# Patient Record
Sex: Female | Born: 1935 | Race: White | Hispanic: No | Smoking: Current every day smoker
Health system: Southern US, Community
[De-identification: ages and names within clinical notes are randomized; demographics above are authoritative.]

## PROBLEM LIST (undated history)

## (undated) DIAGNOSIS — D649 Anemia, unspecified: Secondary | ICD-10-CM

## (undated) DIAGNOSIS — J439 Emphysema, unspecified: Secondary | ICD-10-CM

## (undated) DIAGNOSIS — I10 Essential (primary) hypertension: Secondary | ICD-10-CM

## (undated) DIAGNOSIS — J449 Chronic obstructive pulmonary disease, unspecified: Secondary | ICD-10-CM

## (undated) DIAGNOSIS — E785 Hyperlipidemia, unspecified: Secondary | ICD-10-CM

## (undated) DIAGNOSIS — M1990 Unspecified osteoarthritis, unspecified site: Secondary | ICD-10-CM

## (undated) HISTORY — DX: Chronic obstructive pulmonary disease, unspecified: J44.9

## (undated) HISTORY — DX: Anemia, unspecified: D64.9

## (undated) HISTORY — DX: Emphysema, unspecified: J43.9

## (undated) HISTORY — DX: Essential (primary) hypertension: I10

## (undated) HISTORY — DX: Unspecified osteoarthritis, unspecified site: M19.90

## (undated) HISTORY — PX: ABDOMINAL HYSTERECTOMY: SHX81

## (undated) HISTORY — DX: Hyperlipidemia, unspecified: E78.5

---

## 1999-10-20 ENCOUNTER — Encounter: Payer: Self-pay | Admitting: Emergency Medicine

## 1999-10-20 ENCOUNTER — Emergency Department (HOSPITAL_COMMUNITY): Admission: EM | Admit: 1999-10-20 | Discharge: 1999-10-21 | Payer: Self-pay | Admitting: Emergency Medicine

## 2005-08-11 ENCOUNTER — Ambulatory Visit: Payer: Self-pay | Admitting: Family Medicine

## 2005-08-18 ENCOUNTER — Ambulatory Visit: Payer: Self-pay | Admitting: Family Medicine

## 2006-04-25 ENCOUNTER — Ambulatory Visit: Payer: Self-pay | Admitting: Family Medicine

## 2007-08-30 ENCOUNTER — Encounter: Payer: Self-pay | Admitting: Family Medicine

## 2007-09-26 ENCOUNTER — Telehealth: Payer: Self-pay | Admitting: Family Medicine

## 2007-10-31 ENCOUNTER — Other Ambulatory Visit: Admission: RE | Admit: 2007-10-31 | Discharge: 2007-10-31 | Payer: Self-pay | Admitting: Family Medicine

## 2007-10-31 ENCOUNTER — Encounter: Payer: Self-pay | Admitting: Family Medicine

## 2007-10-31 ENCOUNTER — Ambulatory Visit: Payer: Self-pay | Admitting: Family Medicine

## 2007-10-31 DIAGNOSIS — M810 Age-related osteoporosis without current pathological fracture: Secondary | ICD-10-CM | POA: Insufficient documentation

## 2007-10-31 DIAGNOSIS — E039 Hypothyroidism, unspecified: Secondary | ICD-10-CM | POA: Insufficient documentation

## 2007-10-31 DIAGNOSIS — E785 Hyperlipidemia, unspecified: Secondary | ICD-10-CM | POA: Insufficient documentation

## 2007-10-31 DIAGNOSIS — I10 Essential (primary) hypertension: Secondary | ICD-10-CM | POA: Insufficient documentation

## 2007-10-31 DIAGNOSIS — T50995A Adverse effect of other drugs, medicaments and biological substances, initial encounter: Secondary | ICD-10-CM | POA: Insufficient documentation

## 2007-10-31 DIAGNOSIS — M159 Polyosteoarthritis, unspecified: Secondary | ICD-10-CM | POA: Insufficient documentation

## 2007-10-31 DIAGNOSIS — D649 Anemia, unspecified: Secondary | ICD-10-CM | POA: Insufficient documentation

## 2007-11-14 ENCOUNTER — Encounter: Payer: Self-pay | Admitting: Family Medicine

## 2008-10-29 ENCOUNTER — Ambulatory Visit: Payer: Self-pay | Admitting: Family Medicine

## 2008-10-29 DIAGNOSIS — J449 Chronic obstructive pulmonary disease, unspecified: Secondary | ICD-10-CM | POA: Insufficient documentation

## 2008-10-29 DIAGNOSIS — J4489 Other specified chronic obstructive pulmonary disease: Secondary | ICD-10-CM | POA: Insufficient documentation

## 2008-12-13 ENCOUNTER — Emergency Department (HOSPITAL_COMMUNITY): Admission: EM | Admit: 2008-12-13 | Discharge: 2008-12-13 | Payer: Self-pay | Admitting: Emergency Medicine

## 2009-04-21 ENCOUNTER — Ambulatory Visit: Payer: Self-pay | Admitting: Family Medicine

## 2010-03-11 ENCOUNTER — Ambulatory Visit: Payer: Self-pay | Admitting: Family Medicine

## 2010-03-18 LAB — CONVERTED CEMR LAB
ALT: 15 units/L (ref 0–35)
AST: 18 units/L (ref 0–37)
Albumin: 4.2 g/dL (ref 3.5–5.2)
Alkaline Phosphatase: 59 units/L (ref 39–117)
BUN: 19 mg/dL (ref 6–23)
Basophils Absolute: 0 10*3/uL (ref 0.0–0.1)
Basophils Relative: 0.2 % (ref 0.0–3.0)
Bilirubin, Direct: 0.1 mg/dL (ref 0.0–0.3)
CO2: 30 meq/L (ref 19–32)
Calcium: 9.6 mg/dL (ref 8.4–10.5)
Chloride: 102 meq/L (ref 96–112)
Cholesterol: 283 mg/dL — ABNORMAL HIGH (ref 0–200)
Creatinine, Ser: 0.9 mg/dL (ref 0.4–1.2)
Direct LDL: 125.8 mg/dL
Eosinophils Absolute: 0 10*3/uL (ref 0.0–0.7)
Eosinophils Relative: 0.5 % (ref 0.0–5.0)
GFR calc non Af Amer: 64.37 mL/min (ref >60)
Glucose, Bld: 87 mg/dL (ref 70–99)
HCT: 42.2 % (ref 36.0–46.0)
HDL: 141 mg/dL (ref >39.00)
Hemoglobin: 13.8 g/dL (ref 12.0–15.0)
Lymphocytes Relative: 17.8 % (ref 12.0–46.0)
Lymphs Abs: 1.4 10*3/uL (ref 0.7–4.0)
MCHC: 32.8 g/dL (ref 30.0–36.0)
MCV: 96.9 fL (ref 78.0–100.0)
Monocytes Absolute: 0.8 10*3/uL (ref 0.1–1.0)
Monocytes Relative: 9.8 % (ref 3.0–12.0)
Neutro Abs: 5.7 10*3/uL (ref 1.4–7.7)
Neutrophils Relative %: 71.7 % (ref 43.0–77.0)
Platelets: 272 10*3/uL (ref 150.0–400.0)
Potassium: 3.9 meq/L (ref 3.5–5.1)
RBC: 4.35 M/uL (ref 3.87–5.11)
RDW: 12.4 % (ref 11.5–14.6)
Sodium: 142 meq/L (ref 135–145)
TSH: 1.42 microintl units/mL (ref 0.35–5.50)
Total Bilirubin: 0.8 mg/dL (ref 0.3–1.2)
Total CHOL/HDL Ratio: 2
Total Protein: 7.3 g/dL (ref 6.0–8.3)
Triglycerides: 70 mg/dL (ref 0.0–149.0)
VLDL: 14 mg/dL (ref 0.0–40.0)
Vit D, 25-Hydroxy: 12 ng/mL — ABNORMAL LOW (ref 30–89)
WBC: 7.9 10*3/uL (ref 4.5–10.5)

## 2010-03-25 ENCOUNTER — Encounter: Payer: Self-pay | Admitting: Family Medicine

## 2010-05-05 ENCOUNTER — Ambulatory Visit: Payer: Self-pay | Admitting: Family Medicine

## 2011-01-23 LAB — CONVERTED CEMR LAB
ALT: 17 units/L (ref 0–35)
AST: 21 units/L (ref 0–37)
Albumin: 4.1 g/dL (ref 3.5–5.2)
Alkaline Phosphatase: 67 units/L (ref 39–117)
BUN: 13 mg/dL (ref 6–23)
Basophils Absolute: 0 10*3/uL (ref 0.0–0.1)
Basophils Relative: 0.2 % (ref 0.0–1.0)
Bilirubin, Direct: 0.1 mg/dL (ref 0.0–0.3)
CO2: 30 meq/L (ref 19–32)
Calcium: 9.8 mg/dL (ref 8.4–10.5)
Chloride: 105 meq/L (ref 96–112)
Cholesterol: 284 mg/dL (ref 0–200)
Creatinine, Ser: 0.7 mg/dL (ref 0.4–1.2)
Direct LDL: 133.3 mg/dL
Eosinophils Absolute: 0 10*3/uL (ref 0.0–0.6)
Eosinophils Relative: 0.4 % (ref 0.0–5.0)
GFR calc Af Amer: 105 mL/min
GFR calc non Af Amer: 87 mL/min
Glucose, Bld: 82 mg/dL (ref 70–99)
HCT: 42.7 % (ref 36.0–46.0)
HDL: 127.6 mg/dL (ref >39.0)
Hemoglobin: 15.1 g/dL — ABNORMAL HIGH (ref 12.0–15.0)
Lymphocytes Relative: 22.2 % (ref 12.0–46.0)
MCHC: 35.4 g/dL (ref 30.0–36.0)
MCV: 96.3 fL (ref 78.0–100.0)
Monocytes Absolute: 0.7 10*3/uL (ref 0.2–0.7)
Monocytes Relative: 10.3 % (ref 3.0–11.0)
Neutro Abs: 4.8 10*3/uL (ref 1.4–7.7)
Neutrophils Relative %: 66.9 % (ref 43.0–77.0)
Platelets: 255 10*3/uL (ref 150–400)
Potassium: 4.7 meq/L (ref 3.5–5.1)
RBC: 4.43 M/uL (ref 3.87–5.11)
RDW: 12.5 % (ref 11.5–14.6)
Sodium: 143 meq/L (ref 135–145)
TSH: 1.33 microintl units/mL (ref 0.35–5.50)
Total Bilirubin: 0.8 mg/dL (ref 0.3–1.2)
Total CHOL/HDL Ratio: 2.2
Total Protein: 6.8 g/dL (ref 6.0–8.3)
Triglycerides: 81 mg/dL (ref 0–149)
VLDL: 16 mg/dL (ref 0–40)
WBC: 7.1 10*3/uL (ref 4.5–10.5)

## 2011-01-25 NOTE — Letter (Signed)
Summary: Generic Letter  [place] [facility]  [address] [place], [place] [address]   Phone: [phone]  Fax: [phone]    03/25/2010  AUNA MIKKELSEN [address] [place], [place]  [address]  Dear Ms. Yetta Barre,  We have tried numerous times to reach you via phone but unsuccessful. Your lab report was good except your Vitamin D was low in which I have sent in a prescription in to your local drug store.          Sincerely,   Dr. Gwenyth Bender Stafford,MD

## 2011-01-25 NOTE — Assessment & Plan Note (Signed)
Summary: medical clearance for playin tennis/cjr   Vital Signs:  Patient profile:   77 year old female Weight:      103 pounds BMI:     19.53 O2 Sat:      93 % Temp:     98.3 degrees F Pulse rate:   102 / minute BP sitting:   160 / 118  (left arm)  Vitals Entered By: Pura Spice, RN (March 11, 2010 3:12 PM) CC: wants clearance to play tennis Been off BP med for 1 yr.  smoking 1 ppd    History of Present Illness: this 77 year old white female is in for followup evaluation of her blood pressure and physical and evaluation regarding returning to playing tennis. Her blood pressure was found to be elevated on arrival is 160/118 and repeated and was 160/110. She relates she has not taken her blood pressure medicine in the past year. Relates that her arthritis is improved including her right shoulder which had limited movement proximally her go but is now much better. She has stopped Zestoretic as well as Fosamax She desires to discuss a non-medicine approach to her cessation of smoking She has not had a mammogram or bone density in 2 years but recommended at this time She has no major complaints except that she does have some stiffness after sitting  Preventive Screening-Counseling & Management  Alcohol-Tobacco     Smoking Status: current     Packs/Day: 1.0     Year Started: 1971  Allergies (verified): No Known Drug Allergies  Past History:  Past Medical History: Last updated: 10/31/2007 Hypertension  Past Surgical History: Last updated: 10/31/2007 Hysterectomy  Risk Factors: Smoking Status: current (03/11/2010) Packs/Day: 1.0 (03/11/2010)  Social History: Smoking Status:  current Packs/Day:  1.0  Review of Systems  The patient denies anorexia, fever, weight loss, weight gain, vision loss, decreased hearing, hoarseness, chest pain, syncope, dyspnea on exertion, peripheral edema, prolonged cough, headaches, hemoptysis, abdominal pain, melena, hematochezia, severe  indigestion/heartburn, hematuria, incontinence, genital sores, muscle weakness, suspicious skin lesions, transient blindness, difficulty walking, depression, unusual weight change, abnormal bleeding, enlarged lymph nodes, angioedema, breast masses, and testicular masses.    Physical Exam  General:  Well-developed,well-nourished,in no acute distress; alert,appropriate and cooperative throughout examinationunderweight appearing.   Head:  Normocephalic and atraumatic without obvious abnormalities. No apparent alopecia or balding. Eyes:  No corneal or conjunctival inflammation noted. EOMI. Perrla. Funduscopic exam benign, without hemorrhages, exudates or papilledema. Vision grossly normal. Ears:  External ear exam shows no significant lesions or deformities.  Otoscopic examination reveals clear canals, tympanic membranes are intact bilaterally without bulging, retraction, inflammation or discharge. Hearing is grossly normal bilaterally. Nose:  External nasal examination shows no deformity or inflammation. Nasal mucosa are pink and moist without lesions or exudates. Mouth:  Oral mucosa and oropharynx without lesions or exudates.  Teeth in good repair. Neck:  tenderness right lateral cervical spine Chest Wall:  No deformities, masses, or tenderness noted. Breasts:  No mass, nodules, thickening, tenderness, bulging, retraction, inflamation, nipple discharge or skin changes noted.   Lungs:  Normal respiratory effort, chest expands symmetrically. Lungs are clear to auscultation, no crackles or wheezes. Heart:  Normal rate and regular rhythm. S1 and S2 normal without gallop, murmur, click, rub or other extra sounds. Abdomen:  Bowel sounds positive,abdomen soft and non-tender without masses, organomegaly or hernias noted. Rectal:  not examined Genitalia:  not examined Msk:  examination of the right shoulder is essentially normal this visit compared to last year  when she had limited movement and pain on  movement Pulses:  R and L carotid,radial,femoral,dorsalis pedis and posterior tibial pulses are full and equal bilaterally Extremities:  No clubbing, cyanosis, edema, or deformity noted with normal full range of motion of all joints.   Neurologic:  No cranial nerve deficits noted. Station and gait are normal. Plantar reflexes are down-going bilaterally. DTRs are symmetrical throughout. Sensory, motor and coordinative functions appear intact.   Impression & Recommendations:  Problem # 1:  HYPERTENSION (ICD-401.9) Assessment Deteriorated  The following medications were removed from the medication list:    Zestoretic 20-12.5 Mg Tabs (Lisinopril-hydrochlorothiazide) .Marland Kitchen... 1 once daily for blood pressure Her updated medication list for this problem includes:    Lisinopril 20 Mg Tabs (Lisinopril) .Marland Kitchen... 1 each day for blood pressure  Orders: TLB-BMP (Basic Metabolic Panel-BMET) (80048-METABOL)  Problem # 2:  COPD (ICD-496) Assessment: Unchanged  Problem # 3:  ARTHRITIS (ICD-716.90) Assessment: Improved Nabumetone 750 mg bid  Problem # 4:  UNSPECIFIED OSTEOPOROSIS (ICD-733.00) Assessment: Unchanged  Her updated medication list for this problem includes:    Fosamax 70 Mg Tabs (Alendronate sodium) .Marland Kitchen... 1 tab weekly bone density study to be done in near future Orders: T-Vitamin D (25-Hydroxy) (82956-21308)  Complete Medication List: 1)  Nabumetone 750 Mg Tabs (Nabumetone) .Marland Kitchen.. 1 two times a day after meals for arthritis 2)  Fosamax 70 Mg Tabs (Alendronate sodium) .Marland Kitchen.. 1 tab weekly 3)  Aspirin Adult Low Strength 81 Mg Tbec (Aspirin) .Marland Kitchen.. 1 qd 4)  Lisinopril 20 Mg Tabs (Lisinopril) .Marland Kitchen.. 1 each day for blood pressure  Other Orders: Venipuncture (65784) TLB-Lipid Panel (80061-LIPID) TLB-CBC Platelet - w/Differential (85025-CBCD) TLB-Hepatic/Liver Function Pnl (80076-HEPATIC) TLB-TSH (Thyroid Stimulating Hormone) (84443-TSH) UA Dipstick w/o Micro (automated)  (81003) Prescription  Created Electronically [id number])  Patient Instructions: 1)  Feelm good about stopping smoking 2)  Return in 6 weeks for blood pressure 3)  start calcium600 with Vit D 400 twice daily to prevent osteoporosis 4)  schedule mammogram and bone density 5)  continue Fosamax and Nabumetone and aspirin 81 mg Prescriptions: NABUMETONE 750 MG TABS (NABUMETONE) 1 two times a day after meals for arthritis  #60 x 11   Entered and Authorized by:   Judithann Sheen MD   Signed by:   Judithann Sheen MD on 03/11/2010   Method used:   Electronically to        [facility]  [phone]* (retail)       [address]       [place], [place]  [address]       Ph: [phone] or [phone]       Fax: [phone]   RxID:   [id number] LISINOPRIL 20 MG TABS (LISINOPRIL) 1 each day for blood pressure  #30 x 11   Entered and Authorized by:   Judithann Sheen MD   Signed by:   Judithann Sheen MD on 03/11/2010   Method used:   Electronically to        [facility]  [phone]* (retail)       [address]       [place], [place]  [address]       Ph: [phone] or [phone]       Fax: [phone]   RxID:   [id number] FOSAMAX 70 MG TABS (ALENDRONATE SODIUM) 1 tab weekly  #4 x 11  Entered and Authorized by:   Judithann Sheen MD   Signed by:   Judithann Sheen MD on 03/11/2010   Method used:   Electronically to        [facility]  [phone]* (retail)       [address]       [place], [place]  [address]       Ph: [phone] or [phone]       Fax: [phone]   RxID:   [id number]   Appended Document: Orders Update     Clinical Lists Changes  Observations: Added new observation of COMMENTS: Wynona Canes, CMA  March 12, 2010 8:27 AM  (03/11/2010 8:26) Added new observation of PH URINE: 5.5  (03/11/2010 8:26) Added new observation of SPEC GR URIN: 1.025   (03/11/2010 8:26) Added new observation of APPEARANCE U: Clear  (03/11/2010 8:26) Added new observation of UA COLOR: yellow  (03/11/2010 8:26) Added new observation of WBC DIPSTK U: negative  (03/11/2010 8:26) Added new observation of NITRITE URN: negative  (03/11/2010 8:26) Added new observation of UROBILINOGEN: 0.2  (03/11/2010 8:26) Added new observation of PROTEIN, URN: trace  (03/11/2010 8:26) Added new observation of BLOOD UR DIP: negative  (03/11/2010 8:26) Added new observation of KETONES URN: negative  (03/11/2010 8:26) Added new observation of BILIRUBIN UR: negative  (03/11/2010 8:26) Added new observation of GLUCOSE, URN: negative  (03/11/2010 8:26)      Laboratory Results   Urine Tests  Date/Time Recieved: March 12, 2010 8:27 AM  Date/Time Reported: March 12, 2010 8:27 AM   Routine Urinalysis   Color: yellow Appearance: Clear Glucose: negative   (Normal Range: Negative) Bilirubin: negative   (Normal Range: Negative) Ketone: negative   (Normal Range: Negative) Spec. Gravity: 1.025   (Normal Range: 1.003-1.035) Blood: negative   (Normal Range: Negative) pH: 5.5   (Normal Range: 5.0-8.0) Protein: trace   (Normal Range: Negative) Urobilinogen: 0.2   (Normal Range: 0-1) Nitrite: negative   (Normal Range: Negative) Leukocyte Esterace: negative   (Normal Range: Negative)    Comments: Wynona Canes, CMA  March 12, 2010 8:27 AM     Appended Document: medical clearance for playin tennis/cjr ng urine

## 2011-01-25 NOTE — Assessment & Plan Note (Signed)
Summary: fu per pt/njr   Vital Signs:  Patient profile:   77 year old female Weight:      103 pounds O2 Sat:      96 % Temp:     98.2 degrees F Pulse rate:   110 / minute BP sitting:   180 / 100  (left arm)  Vitals Entered By: Pura Spice, RN (May 05, 2010 11:06 AM) CC: reck bp    History of Present Illness: This 77 year old white single female who smokes one half pack of cigarettes per day is in for followup examination of her hypertension and her arthritis also did discuss osteopenia Her blood pressure was rechecked after her arrival and was 152/90 She thinks to be good tone has been doing fine regarding her arthritis  and would like a refill No new complaints  Preventive Screening-Counseling & Management  Alcohol-Tobacco     Smoking Status: current     Packs/Day: 0.5     Year Started: 1951  Allergies (verified): No Known Drug Allergies  Past History:  Past Medical History: Last updated: 10/31/2007 Hypertension  Past Surgical History: Last updated: 10/31/2007 Hysterectomy  Social History: Last updated: 05/05/2010 Current Smoker  Risk Factors: Smoking Status: current (05/05/2010) Packs/Day: 0.5 (05/05/2010)  Social History: Current Smoker Packs/Day:  0.5  Review of Systems      See HPI  The patient denies anorexia, fever, weight loss, weight gain, vision loss, decreased hearing, hoarseness, chest pain, syncope, dyspnea on exertion, peripheral edema, prolonged cough, headaches, hemoptysis, abdominal pain, melena, hematochezia, severe indigestion/heartburn, hematuria, incontinence, genital sores, muscle weakness, suspicious skin lesions, transient blindness, difficulty walking, depression, unusual weight change, abnormal bleeding, enlarged lymph nodes, angioedema, breast masses, and testicular masses.    Physical Exam  General:  Well-developed,well-nourished,in no acute distress; alert,appropriate and cooperative throughout examinationunderweight  appearing.   Neck:  No deformities, masses, or tenderness noted.carotid normal Lungs:  Normal respiratory effort, chest expands symmetrically. Lungs are clear to auscultation, no crackles or wheezes. Heart:  Normal rate and regular rhythm. S1 and S2 normal without gallop, murmur, click, rub or other extra sounds. Abdomen:  Bowel sounds positive,abdomen soft and non-tender without masses, organomegaly or hernias noted. Msk:  No deformity or scoliosis noted of thoracic or lumbar spine.   Extremities:  No clubbing, cyanosis, edema, or deformity noted with normal full range of motion of all joints.     Impression & Recommendations:  Problem # 1:  COPD (ICD-496) Assessment Unchanged not severe requiring medication  Problem # 2:  ARTHRITIS (ICD-716.90) Assessment: Improved  Orders: Prescription Created Electronically [id number])  Problem # 3:  HYPERTENSION (ICD-401.9) Assessment: Improved  The following medications were removed from the medication list:    Lisinopril 20 Mg Tabs (Lisinopril) .Marland Kitchen... 1 each day for blood pressure Her updated medication list for this problem includes:    Lisinopril-hydrochlorothiazide 20-12.5 Mg Tabs (Lisinopril-hydrochlorothiazide) .Marland Kitchen... 1 once daily for blood pressure  Complete Medication List: 1)  Nabumetone 750 Mg Tabs (Nabumetone) .Marland Kitchen.. 1 two times a day after meals for arthritis 2)  Fosamax 70 Mg Tabs (Alendronate sodium) .Marland Kitchen.. 1 tab weekly 3)  Aspirin Adult Low Strength 81 Mg Tbec (Aspirin) .Marland Kitchen.. 1 qd 4)  Vitamin D (ergocalciferol) 50000 Unit Caps (Ergocalciferol) .Marland KitchenMarland KitchenMarland Kitchen 1 weekly for 12 weeks 5)  Lisinopril-hydrochlorothiazide 20-12.5 Mg Tabs (Lisinopril-hydrochlorothiazide) .Marland Kitchen.. 1 once daily for blood pressure  Patient Instructions: 1)  bLOOD PRESSURE IS BETTER THAN BEFORE BUT NEEDS TI INCRASE LISINOPRIL-hct 1 per day 2)  continue nabumetone twice dailky  for arthritis 3)  Take Aspirin 81 mg eaach day 4)  continue Fosama weekly Prescriptions: FOSAMAX 70  MG TABS (ALENDRONATE SODIUM) 1 tab weekly  #4 x 11   Entered and Authorized by:   Judithann Sheen MD   Signed by:   Judithann Sheen MD on 05/05/2010   Method used:   Electronically to        [facility]  [phone]* (retail)       [address]       [place], [place]  [address]       Ph: [phone] or [phone]       Fax: [phone]   RxID:   [id number] LISINOPRIL-HYDROCHLOROTHIAZIDE 20-12.5 MG TABS (LISINOPRIL-HYDROCHLOROTHIAZIDE) 1 once daily FOR BLOOD PRESSURE  #30 x 11   Entered and Authorized by:   Judithann Sheen MD   Signed by:   Judithann Sheen MD on 05/05/2010   Method used:   Electronically to        [facility]  [phone]* (retail)       [address]       [place], [place]  [address]       Ph: [phone] or [phone]       Fax: [phone]   RxID:   [id number]

## 2011-04-12 ENCOUNTER — Other Ambulatory Visit: Payer: Self-pay

## 2011-04-12 MED ORDER — NABUMETONE 750 MG PO TABS: 750.0000 mg | ORAL_TABLET | Freq: Two times a day (BID) | ORAL | Status: DC

## 2011-04-12 NOTE — Telephone Encounter (Signed)
rx faxed to [facility]

## 2011-05-12 ENCOUNTER — Telehealth: Payer: Self-pay | Admitting: Family Medicine

## 2011-05-12 NOTE — Telephone Encounter (Signed)
Pt has changed to [facility] mail order and wanted to make Dr Scotty Court aware that [facility] will be sending refill req for pts meds, Lisinopril/HCTZ 20-12.5 mg (Relafen) 750 mg

## 2011-05-13 MED ORDER — NABUMETONE 750 MG PO TABS: 750.0000 mg | ORAL_TABLET | Freq: Two times a day (BID) | ORAL | Status: DC

## 2011-05-13 MED ORDER — LISINOPRIL-HYDROCHLOROTHIAZIDE 20-12.5 MG PO TABS: 1.0000 | ORAL_TABLET | Freq: Every day | ORAL | Status: DC

## 2011-05-13 NOTE — Telephone Encounter (Signed)
Ok per Dr. Scotty Court to sent rx to [facility] for lisinopril and nabumetone

## 2011-05-18 ENCOUNTER — Ambulatory Visit: Payer: Self-pay | Admitting: Family Medicine

## 2011-05-19 ENCOUNTER — Other Ambulatory Visit: Payer: Self-pay

## 2011-05-19 NOTE — Telephone Encounter (Signed)
Error

## 2011-06-01 ENCOUNTER — Ambulatory Visit (INDEPENDENT_AMBULATORY_CARE_PROVIDER_SITE_OTHER): Payer: Medicare Other | Admitting: Family Medicine

## 2011-06-01 ENCOUNTER — Encounter: Payer: Self-pay | Admitting: Family Medicine

## 2011-06-01 VITALS — BP 110/88 | HR 97 | Temp 98.8°F | Wt 102.0 lb

## 2011-06-01 DIAGNOSIS — R350 Frequency of micturition: Secondary | ICD-10-CM

## 2011-06-01 DIAGNOSIS — E785 Hyperlipidemia, unspecified: Secondary | ICD-10-CM

## 2011-06-01 DIAGNOSIS — E039 Hypothyroidism, unspecified: Secondary | ICD-10-CM

## 2011-06-01 DIAGNOSIS — J449 Chronic obstructive pulmonary disease, unspecified: Secondary | ICD-10-CM

## 2011-06-01 DIAGNOSIS — D649 Anemia, unspecified: Secondary | ICD-10-CM

## 2011-06-01 DIAGNOSIS — M129 Arthropathy, unspecified: Secondary | ICD-10-CM

## 2011-06-01 DIAGNOSIS — M1990 Unspecified osteoarthritis, unspecified site: Secondary | ICD-10-CM

## 2011-06-01 DIAGNOSIS — I10 Essential (primary) hypertension: Secondary | ICD-10-CM

## 2011-06-01 LAB — LIPID PANEL
Cholesterol: 292 mg/dL — ABNORMAL HIGH (ref 0–200)
HDL: 127 mg/dL (ref >39.00)
Total CHOL/HDL Ratio: 2
Triglycerides: 91 mg/dL (ref 0.0–149.0)
VLDL: 18.2 mg/dL (ref 0.0–40.0)

## 2011-06-01 LAB — CBC WITH DIFFERENTIAL/PLATELET
Basophils Relative: 0.9 % (ref 0.0–3.0)
Eosinophils Relative: 1 % (ref 0.0–5.0)
Lymphocytes Relative: 21.5 % (ref 12.0–46.0)
Monocytes Relative: 10.8 % (ref 3.0–12.0)
Neutrophils Relative %: 65.8 % (ref 43.0–77.0)
Platelets: 310 10*3/uL (ref 150.0–400.0)
RBC: 3.89 Mil/uL (ref 3.87–5.11)
WBC: 7.9 10*3/uL (ref 4.5–10.5)

## 2011-06-01 LAB — POCT URINALYSIS DIPSTICK
Glucose, UA: NEGATIVE
Ketones, UA: NEGATIVE
Leukocytes, UA: NEGATIVE
Protein, UA: NEGATIVE
Spec Grav, UA: 1.02
Urobilinogen, UA: 0.2

## 2011-06-01 LAB — BASIC METABOLIC PANEL
CO2: 30 mEq/L (ref 19–32)
Calcium: 9.9 mg/dL (ref 8.4–10.5)
Chloride: 106 mEq/L (ref 96–112)
Creatinine, Ser: 1.3 mg/dL — ABNORMAL HIGH (ref 0.4–1.2)
Sodium: 143 mEq/L (ref 135–145)

## 2011-06-01 LAB — TSH: TSH: 1.39 u[IU]/mL (ref 0.35–5.50)

## 2011-06-01 MED ORDER — SULINDAC 200 MG PO TABS: 200.0000 mg | ORAL_TABLET | Freq: Two times a day (BID) | ORAL | Status: DC

## 2011-06-01 NOTE — Patient Instructions (Addendum)
For arthritis take new tab sulindac 200mg  twice daily after meals or if not improved to take Aleve 2 tabs q. day Take ASA 81 mg daily to help prevent heart attack A s usuaL i HAVE TO RECOMMEND SMOKING CESSATION Will call lab results Please get mammogram and bone density in  Near future Try to increase walking or activity

## 2011-06-10 ENCOUNTER — Telehealth: Payer: Self-pay

## 2011-06-10 NOTE — Telephone Encounter (Signed)
Called to give lab results; left a message for pt to return call; Pt needs to repeat test in 6 weeks.

## 2011-06-14 MED ORDER — NAPROXEN SODIUM 220 MG PO TABS: 220.0000 mg | ORAL_TABLET | Freq: Two times a day (BID) | ORAL | Status: DC

## 2011-06-14 NOTE — Progress Notes (Signed)
  Subjective:    Patient ID: Kaylee Li, female    DOB: 03-14-1930, 77 y.o.   MRN: [id number] HPI This 77 year old white didn't have discussed the fact that she continues to have arthritic pain but has bee better since she started Aleve 2 tabs each day. She had been on nabumetone 750 b.i.d. But was not helpful she has been taking the Aleve for some time and it is beneficial Discussed smoking cessation which patient has diminished from 2 packs one pack per day Continues to have shortness of breath on exertion and pO2 94 Blood pressure had been well-controlled 110/88 and haven't continue lisinopril hydrochlorothiazide 20-12.5 mg each day Patient continues to have some neck pain as well as leg pain   Review of Systemssee history of present illness     Objective:   Physical Exam the patient is a thin white female who is in no distress is alert and cooperative HEENT negative carotid pulses good thyroid non palpable Lungs faint breath sounds minimal rhonchi with slight wheeze on deep expiration Heart no evidence of cardiomegaly heart sounds regular without murmurs rhythm regular peripheral pulses good Breasts atrophic negative no masses felt no tenderness axilla clear nipples normal Abdomen liver spleen kidneys nonpalpable no masses no tenderness normal bowel sounds Pelvic and rectal not done Extremities has tenderness over knee joints bilaterally and both leg Neurological negative exam Skin negative        Assessment & Plan:  Hypertension well controlled continue lisinopril hydrochlorothiazide 20 metacarpal 5 mg each day Osteoarthritis to treat with Aleve 2 tabs each day and to increase to 3 if necessary COPD no treatment at this time however recommend smoking cessation and discussed Chantix Osteopenia she discontinued Fosamax daily calcium 600 with vitamin D 400 b.i.d.

## 2011-07-11 ENCOUNTER — Telehealth: Payer: Self-pay | Admitting: Family Medicine

## 2011-07-11 NOTE — Telephone Encounter (Signed)
Pt is still complaining of her arthritis and would like a referral to [facility]. Fax to --[phone].

## 2011-07-18 ENCOUNTER — Other Ambulatory Visit: Payer: Self-pay | Admitting: Family Medicine

## 2011-07-18 DIAGNOSIS — M129 Arthropathy, unspecified: Secondary | ICD-10-CM

## 2011-07-18 DIAGNOSIS — M810 Age-related osteoporosis without current pathological fracture: Secondary | ICD-10-CM

## 2011-07-21 NOTE — Telephone Encounter (Signed)
Done

## 2011-08-08 ENCOUNTER — Telehealth: Payer: Self-pay

## 2011-08-08 NOTE — Telephone Encounter (Signed)
Per pt's son was calling to talk to Dr. Scotty Court about his mother's prescription.  Pt's son request a call from Dr. Scotty Court.

## 2011-08-09 NOTE — Telephone Encounter (Signed)
Pts son called and said that pt was put on new scriptsulindac (CLINORIL) 200 MG tablet  , by Dr Scotty Court and it is not helping pt. Pt has appt to see Rheumatologist tomorrow, but pts son still would like to speak to Dr Scotty Court.

## 2011-08-09 NOTE — Telephone Encounter (Signed)
Per Dr. Scotty Court he will call pt's son

## 2012-01-12 ENCOUNTER — Ambulatory Visit (INDEPENDENT_AMBULATORY_CARE_PROVIDER_SITE_OTHER): Payer: Medicare Other | Admitting: Internal Medicine

## 2012-01-12 ENCOUNTER — Ambulatory Visit (INDEPENDENT_AMBULATORY_CARE_PROVIDER_SITE_OTHER)
Admission: RE | Admit: 2012-01-12 | Discharge: 2012-01-12 | Disposition: A | Discharge status: Home / Self Care | Payer: Medicare Other | Source: Ambulatory Visit | Attending: Internal Medicine | Admitting: Internal Medicine

## 2012-01-12 ENCOUNTER — Encounter: Payer: Self-pay | Admitting: Internal Medicine

## 2012-01-12 ENCOUNTER — Ambulatory Visit: Payer: Medicare Other | Admitting: Internal Medicine

## 2012-01-12 DIAGNOSIS — E785 Hyperlipidemia, unspecified: Secondary | ICD-10-CM

## 2012-01-12 DIAGNOSIS — D649 Anemia, unspecified: Secondary | ICD-10-CM

## 2012-01-12 DIAGNOSIS — R059 Cough, unspecified: Secondary | ICD-10-CM

## 2012-01-12 DIAGNOSIS — M129 Arthropathy, unspecified: Secondary | ICD-10-CM

## 2012-01-12 DIAGNOSIS — R05 Cough: Secondary | ICD-10-CM

## 2012-01-12 DIAGNOSIS — Z720 Tobacco use: Secondary | ICD-10-CM | POA: Insufficient documentation

## 2012-01-12 DIAGNOSIS — J449 Chronic obstructive pulmonary disease, unspecified: Secondary | ICD-10-CM

## 2012-01-12 DIAGNOSIS — Z23 Encounter for immunization: Secondary | ICD-10-CM

## 2012-01-12 DIAGNOSIS — M810 Age-related osteoporosis without current pathological fracture: Secondary | ICD-10-CM

## 2012-01-12 DIAGNOSIS — I10 Essential (primary) hypertension: Secondary | ICD-10-CM

## 2012-01-12 DIAGNOSIS — Z1231 Encounter for screening mammogram for malignant neoplasm of breast: Secondary | ICD-10-CM

## 2012-01-12 LAB — HM COLONOSCOPY

## 2012-01-12 LAB — HM MAMMOGRAPHY

## 2012-01-12 MED ORDER — ROSUVASTATIN CALCIUM 5 MG PO TABS: 5.0000 mg | ORAL_TABLET | Freq: Every day | ORAL | Status: DC

## 2012-01-12 MED ORDER — ACLIDINIUM BROMIDE 400 MCG/ACT IN AEPB: 1.0000 | INHALATION_SPRAY | Freq: Two times a day (BID) | RESPIRATORY_TRACT | Status: DC

## 2012-01-12 MED ORDER — OLMESARTAN MEDOXOMIL 40 MG PO TABS: 40.0000 mg | ORAL_TABLET | Freq: Every day | ORAL | Status: DC

## 2012-01-12 NOTE — Assessment & Plan Note (Signed)
Check a vitamin D level and ask her to get an updated BMD done

## 2012-01-12 NOTE — Assessment & Plan Note (Signed)
No changes

## 2012-01-12 NOTE — Assessment & Plan Note (Signed)
Check a CXR to look for mass, pna, etc

## 2012-01-12 NOTE — Assessment & Plan Note (Addendum)
Start tudorza for symptom relief, I asked her to quit smoking

## 2012-01-12 NOTE — Assessment & Plan Note (Signed)
After a discussion today she agrees to quit smoking

## 2012-01-12 NOTE — Patient Instructions (Signed)
Chronic Obstructive Pulmonary Disease Chronic obstructive pulmonary disease (COPD) is a condition in which airflow from the lungs is restricted. The lungs can never return to normal, but there are measures you can take which will improve them and make you feel better. CAUSES   Smoking.   Exposure to secondhand smoke.   Breathing in irritants (pollution, cigarette smoke, strong smells, aerosol sprays, paint fumes).   History of lung infections.  TREATMENT  Treatment focuses on making you comfortable (supportive care). Your caregiver may prescribe medications (inhaled or pills) to help improve your breathing. HOME CARE INSTRUCTIONS   If you smoke, stop smoking.   Avoid exposure to smoke, chemicals, and fumes that aggravate your breathing.   Take antibiotic medicines as directed by your caregiver.   Avoid medicines that dry up your system and slow down the elimination of secretions (antihistamines and cough syrups). This decreases respiratory capacity and may lead to infections.   Drink enough water and fluids to keep your urine clear or pale yellow. This loosens secretions.   Use humidifiers at home and at your bedside if they do not make breathing difficult.   Receive all protective vaccines your caregiver suggests, especially pneumococcal and influenza.   Use home oxygen as suggested.   Stay active. Exercise and physical activity will help maintain your ability to do things you want to do.   Eat a healthy diet.  SEEK MEDICAL CARE IF:   You develop pus-like mucus (sputum).   Breathing is more labored or exercise becomes difficult to do.   You are running out of the medicine you take for your breathing.  SEEK IMMEDIATE MEDICAL CARE IF:   You have a rapid heart rate.   You have agitation, confusion, tremors, or are in a stupor (family members may need to observe this).   It becomes difficult to breathe.   You develop chest pain.   You have a fever.  MAKE SURE YOU:    Understand these instructions.   Will watch your condition.   Will get help right away if you are not doing well or get worse.  Document Released: 09/21/2005 Document Revised: 08/24/2011 Document Reviewed: 02/11/2011 [employer] Patient Information 2012 [employer].Hypertension As your heart beats, it forces blood through your arteries. This force is your blood pressure. If the pressure is too high, it is called hypertension (HTN) or high blood pressure. HTN is dangerous because you may have it and not know it. High blood pressure may mean that your heart has to work harder to pump blood. Your arteries may be narrow or stiff. The extra work puts you at risk for heart disease, stroke, and other problems.  Blood pressure consists of two numbers, a higher number over a lower, 110/72, for example. It is stated as "110 over 72." The ideal is below 120 for the top number (systolic) and under 80 for the bottom (diastolic). Write down your blood pressure today. You should pay close attention to your blood pressure if you have certain conditions such as:  Heart failure.   Prior heart attack.   Diabetes   Chronic kidney disease.   Prior stroke.   Multiple risk factors for heart disease.  To see if you have HTN, your blood pressure should be measured while you are seated with your arm held at the level of the heart. It should be measured at least twice. A one-time elevated blood pressure reading (especially in the Emergency Department) does not mean that you need treatment. There   may be conditions in which the blood pressure is different between your right and left arms. It is important to see your caregiver soon for a recheck. Most people have essential hypertension which means that there is not a specific cause. This type of high blood pressure may be lowered by changing lifestyle factors such as:  Stress.   Smoking.   Lack of exercise.   Excessive weight.   Drug/tobacco/alcohol use.    Eating less salt.  Most people do not have symptoms from high blood pressure until it has caused damage to the body. Effective treatment can often prevent, delay or reduce that damage. TREATMENT  When a cause has been identified, treatment for high blood pressure is directed at the cause. There are a large number of medications to treat HTN. These fall into several categories, and your caregiver will help you select the medicines that are best for you. Medications may have side effects. You should review side effects with your caregiver. If your blood pressure stays high after you have made lifestyle changes or started on medicines,   Your medication(s) may need to be changed.   Other problems may need to be addressed.   Be certain you understand your prescriptions, and know how and when to take your medicine.   Be sure to follow up with your caregiver within the time frame advised (usually within two weeks) to have your blood pressure rechecked and to review your medications.   If you are taking more than one medicine to lower your blood pressure, make sure you know how and at what times they should be taken. Taking two medicines at the same time can result in blood pressure that is too low.  SEEK IMMEDIATE MEDICAL CARE IF:  You develop a severe headache, blurred or changing vision, or confusion.   You have unusual weakness or numbness, or a faint feeling.   You have severe chest or abdominal pain, vomiting, or breathing problems.  MAKE SURE YOU:   Understand these instructions.   Will watch your condition.   Will get help right away if you are not doing well or get worse.  Document Released: 12/12/2005 Document Revised: 08/24/2011 Document Reviewed: 08/01/2008 [employer] Patient Information 2012 [employer]. 

## 2012-01-12 NOTE — Assessment & Plan Note (Signed)
BP is not well controlled, I will start benicar today

## 2012-01-12 NOTE — Progress Notes (Signed)
Subjective:    Patient ID: Kaylee Li, female    DOB: 1930-02-24, 78 y.o.   MRN: [id number]  Cough This is a chronic problem. The current episode started more than 1 year ago. The problem has been unchanged. The problem occurs every few hours. The cough is non-productive. Associated symptoms include rhinorrhea and shortness of breath. Pertinent negatives include no chest pain, chills, ear congestion, ear pain, fever, headaches, heartburn, hemoptysis, myalgias, nasal congestion, postnasal drip, rash, sore throat, sweats, weight loss or wheezing. The symptoms are aggravated by nothing. Risk factors for lung disease include smoking/tobacco exposure. She has tried nothing for the symptoms. Her past medical history is significant for COPD.  Hypertension This is a chronic problem. The current episode started more than 1 year ago. The problem is unchanged. The problem is uncontrolled. Associated symptoms include shortness of breath. Pertinent negatives include no anxiety, blurred vision, chest pain, headaches, malaise/fatigue, neck pain, orthopnea, palpitations, peripheral edema, PND or sweats. There are no associated agents to hypertension. Past treatments include nothing. Compliance problems include psychosocial issues.  There is no history of chronic renal disease.  Hyperlipidemia This is a chronic problem. The current episode started more than 1 year ago. The problem is uncontrolled. Recent lipid tests were reviewed and are variable. She has no history of chronic renal disease, diabetes, hypothyroidism, liver disease, obesity or nephrotic syndrome. Factors aggravating her hyperlipidemia include no known factors. Associated symptoms include shortness of breath. Pertinent negatives include no chest pain, focal sensory loss, focal weakness, leg pain or myalgias. She is currently on no antihyperlipidemic treatment.  Anemia Presents for follow-up visit. There has been no abdominal pain, anorexia,  bruising/bleeding easily, confusion, fever, leg swelling, light-headedness, malaise/fatigue, pallor, palpitations, paresthesias, pica or weight loss. Signs of blood loss that are not present include hematemesis, hematochezia, melena and vaginal bleeding. There is no history of chronic renal disease or hypothyroidism. There are no compliance problems.       Review of Systems  Constitutional: Negative for fever, chills, weight loss, malaise/fatigue, diaphoresis, activity change, appetite change, fatigue and unexpected weight change.  HENT: Positive for rhinorrhea. Negative for ear pain, congestion, sore throat, facial swelling, sneezing, trouble swallowing, neck pain, neck stiffness, voice change, postnasal drip, sinus pressure and ear discharge.   Eyes: Negative.  Negative for blurred vision.  Respiratory: Positive for cough and shortness of breath. Negative for apnea, hemoptysis, choking, chest tightness, wheezing and stridor.   Cardiovascular: Negative for chest pain, palpitations, orthopnea, leg swelling and PND.  Gastrointestinal: Negative for heartburn, nausea, vomiting, abdominal pain, diarrhea, constipation, melena, hematochezia, anorexia and hematemesis.  Genitourinary: Negative for dysuria, urgency, frequency, hematuria, vaginal bleeding, enuresis, difficulty urinating and dyspareunia.  Musculoskeletal: Negative for myalgias, back pain, joint swelling, arthralgias and gait problem.  Skin: Negative for color change, pallor, rash and wound.  Neurological: Negative for dizziness, tremors, focal weakness, seizures, syncope, facial asymmetry, speech difficulty, weakness, light-headedness, numbness, headaches and paresthesias.  Hematological: Negative for adenopathy. Does not bruise/bleed easily.  Psychiatric/Behavioral: Positive for decreased concentration. Negative for suicidal ideas, hallucinations, behavioral problems, confusion, sleep disturbance, self-injury, dysphoric mood and agitation.  The patient is not nervous/anxious and is not hyperactive.        Objective:   Physical Exam  Vitals reviewed. Constitutional: She is oriented to person, place, and time. She appears well-developed. She appears cachectic. No distress.  HENT:  Head: Normocephalic and atraumatic.  Mouth/Throat: Oropharynx is clear and moist. No oropharyngeal exudate.  Eyes: Conjunctivae are normal. Right eye exhibits  no discharge. Left eye exhibits no discharge. No scleral icterus.  Neck: Normal range of motion. Neck supple. No JVD present. No tracheal deviation present. No thyromegaly present.  Cardiovascular: Normal rate, regular rhythm, normal heart sounds and intact distal pulses.  Exam reveals no gallop and no friction rub.   No murmur heard. Pulses:      Carotid pulses are 1+ on the right side, and 1+ on the left side.      Radial pulses are 1+ on the right side, and 1+ on the left side.       Femoral pulses are 1+ on the right side, and 1+ on the left side.      Popliteal pulses are 1+ on the right side, and 1+ on the left side.       Dorsalis pedis pulses are 1+ on the right side, and 1+ on the left side.       Posterior tibial pulses are 1+ on the right side, and 1+ on the left side.  Pulmonary/Chest: Effort normal and breath sounds normal. No stridor. No respiratory distress. She has no wheezes. She has no rales. She exhibits no tenderness.  Abdominal: Soft. Bowel sounds are normal. She exhibits no distension and no mass. There is no tenderness. There is no rebound and no guarding.  Musculoskeletal: Normal range of motion. She exhibits no edema and no tenderness.  Lymphadenopathy:    She has no cervical adenopathy.  Neurological: She is oriented to person, place, and time.  Skin: Skin is warm and dry. No rash noted. She is not diaphoretic. No erythema. No pallor.  Psychiatric: Her behavior is normal. Judgment and thought content normal. Her mood appears anxious. Her affect is not angry, not  blunt, not labile and not inappropriate. Her speech is delayed and tangential. Her speech is not rapid and/or pressured and not slurred. Cognition and memory are impaired. She does not express impulsivity or inappropriate judgment. She does not exhibit a depressed mood. She is communicative. She exhibits abnormal recent memory and abnormal remote memory.     Lab Results  Component Value Date   WBC 7.9 06/01/2011   HGB 12.6 06/01/2011   HCT 37.5 06/01/2011   PLT 310.0 06/01/2011   GLUCOSE 96 06/01/2011   CHOL 292* 06/01/2011   TRIG 91.0 06/01/2011   HDL 127.00 06/01/2011   LDLDIRECT 146.8 06/01/2011   ALT 15 03/11/2010   AST 18 03/11/2010   NA 143 06/01/2011   K 5.7* 06/01/2011   CL 106 06/01/2011   CREATININE 1.3* 06/01/2011   BUN 27* 06/01/2011   CO2 30 06/01/2011   TSH 1.39 06/01/2011       Assessment & Plan:

## 2012-01-12 NOTE — Assessment & Plan Note (Signed)
Recheck labs today and start crestor to lower the risk of MI and CVA

## 2012-01-12 NOTE — Assessment & Plan Note (Signed)
No s/s of blood loss, today I will check her CBC and will look at her vitamin levels

## 2012-02-17 ENCOUNTER — Ambulatory Visit (HOSPITAL_COMMUNITY): Payer: Medicare Other

## 2012-02-20 ENCOUNTER — Ambulatory Visit (HOSPITAL_COMMUNITY)
Admission: RE | Admit: 2012-02-20 | Discharge: 2012-02-20 | Disposition: A | Discharge status: Home / Self Care | Payer: Medicare Other | Source: Ambulatory Visit | Attending: Internal Medicine | Admitting: Internal Medicine

## 2012-02-20 ENCOUNTER — Ambulatory Visit (HOSPITAL_COMMUNITY): Payer: Medicare Other

## 2012-02-20 DIAGNOSIS — M810 Age-related osteoporosis without current pathological fracture: Secondary | ICD-10-CM

## 2012-08-08 ENCOUNTER — Ambulatory Visit: Payer: Medicare Other | Admitting: Internal Medicine

## 2012-08-13 ENCOUNTER — Other Ambulatory Visit (INDEPENDENT_AMBULATORY_CARE_PROVIDER_SITE_OTHER): Payer: Medicare Other

## 2012-08-13 ENCOUNTER — Ambulatory Visit: Payer: Medicare Other | Admitting: Internal Medicine

## 2012-08-13 ENCOUNTER — Encounter: Payer: Self-pay | Admitting: Internal Medicine

## 2012-08-13 ENCOUNTER — Ambulatory Visit (INDEPENDENT_AMBULATORY_CARE_PROVIDER_SITE_OTHER): Payer: Medicare Other | Admitting: Internal Medicine

## 2012-08-13 VITALS — BP 130/80 | HR 102 | Temp 97.8°F | Resp 16 | Wt 102.2 lb

## 2012-08-13 DIAGNOSIS — K921 Melena: Secondary | ICD-10-CM

## 2012-08-13 DIAGNOSIS — D649 Anemia, unspecified: Secondary | ICD-10-CM

## 2012-08-13 DIAGNOSIS — R10816 Epigastric abdominal tenderness: Secondary | ICD-10-CM

## 2012-08-13 DIAGNOSIS — I10 Essential (primary) hypertension: Secondary | ICD-10-CM

## 2012-08-13 DIAGNOSIS — E785 Hyperlipidemia, unspecified: Secondary | ICD-10-CM

## 2012-08-13 DIAGNOSIS — M129 Arthropathy, unspecified: Secondary | ICD-10-CM

## 2012-08-13 LAB — URINALYSIS, ROUTINE W REFLEX MICROSCOPIC
Ketones, ur: NEGATIVE
Specific Gravity, Urine: >=1.03 (ref 1.000–1.030)
Urine Glucose: NEGATIVE
pH: 5 (ref 5.0–8.0)

## 2012-08-13 LAB — CBC WITH DIFFERENTIAL/PLATELET
Basophils Absolute: 0 10*3/uL (ref 0.0–0.1)
HCT: 35.6 % — ABNORMAL LOW (ref 36.0–46.0)
Hemoglobin: 11.7 g/dL — ABNORMAL LOW (ref 12.0–15.0)
Lymphs Abs: 1.2 10*3/uL (ref 0.7–4.0)
MCV: 94.5 fl (ref 78.0–100.0)
Monocytes Absolute: 0.9 10*3/uL (ref 0.1–1.0)
Monocytes Relative: 9.1 % (ref 3.0–12.0)
Neutro Abs: 7.8 10*3/uL — ABNORMAL HIGH (ref 1.4–7.7)
RDW: 13.2 % (ref 11.5–14.6)

## 2012-08-13 LAB — IBC PANEL: Saturation Ratios: 14.9 % — ABNORMAL LOW (ref 20.0–50.0)

## 2012-08-13 LAB — COMPREHENSIVE METABOLIC PANEL
AST: 15 U/L (ref 0–37)
Albumin: 3.6 g/dL (ref 3.5–5.2)
Alkaline Phosphatase: 58 U/L (ref 39–117)
Calcium: 9.4 mg/dL (ref 8.4–10.5)
Chloride: 103 mEq/L (ref 96–112)
Glucose, Bld: 115 mg/dL — ABNORMAL HIGH (ref 70–99)
Potassium: 4 mEq/L (ref 3.5–5.1)
Sodium: 138 mEq/L (ref 135–145)
Total Protein: 6.4 g/dL (ref 6.0–8.3)

## 2012-08-13 LAB — FERRITIN: Ferritin: 38.5 ng/mL (ref 10.0–291.0)

## 2012-08-13 LAB — LIPID PANEL
LDL Cholesterol: 77 mg/dL (ref 0–99)
VLDL: 15 mg/dL (ref 0.0–40.0)

## 2012-08-13 LAB — TSH: TSH: 1.79 u[IU]/mL (ref 0.35–5.50)

## 2012-08-13 MED ORDER — TRAMADOL HCL 50 MG PO TABS: 50.0000 mg | ORAL_TABLET | Freq: Three times a day (TID) | ORAL | Status: AC | PRN

## 2012-08-13 MED ORDER — ESOMEPRAZOLE MAGNESIUM 40 MG PO CPDR: 40.0000 mg | DELAYED_RELEASE_CAPSULE | Freq: Every day | ORAL | Status: DC

## 2012-08-13 NOTE — Assessment & Plan Note (Signed)
CBC today and GI referral

## 2012-08-13 NOTE — Assessment & Plan Note (Signed)
For joint pain, stop aleve and start tramadol

## 2012-08-13 NOTE — Assessment & Plan Note (Signed)
Her BP is well controlled, I will check her lytes and renal function 

## 2012-08-13 NOTE — Patient Instructions (Signed)

## 2012-08-13 NOTE — Assessment & Plan Note (Signed)
She has several risk factors for an UGI ulcer with bleeding so I have asked her to start nexium and to stop aleve and tobacco abuse, she has been referred to GI to consider getting an upper endoscopy done. Today I will check her labs for anemia, hepatitis, pancreatitis, renal failure, etc.

## 2012-08-13 NOTE — Assessment & Plan Note (Signed)
FLP today 

## 2012-08-13 NOTE — Progress Notes (Signed)
Subjective:    Patient ID: Kaylee Li, female    DOB: Jun 16, 1930, 78 y.o.   MRN: [id number]  Abdominal Pain This is a recurrent problem. The current episode started more than 1 month ago. The onset quality is gradual. The problem occurs intermittently. The problem has been gradually worsening. The pain is located in the epigastric region. The pain is at a severity of 2/10. The pain is mild. The quality of the pain is cramping. The abdominal pain radiates to the LUQ and RUQ. Associated symptoms include melena, nausea and vomiting. Pertinent negatives include no anorexia, arthralgias, belching, constipation, diarrhea, dysuria, fever, flatus, frequency, headaches, hematochezia, hematuria, myalgias or weight loss. The pain is aggravated by NSAIDs. The pain is relieved by nothing. She has tried acetaminophen for the symptoms. The treatment provided mild relief.      Review of Systems  Constitutional: Negative for fever, chills, weight loss, diaphoresis, activity change, appetite change, fatigue and unexpected weight change.  HENT: Negative.   Eyes: Negative.   Respiratory: Negative for cough, chest tightness, shortness of breath, wheezing and stridor.   Cardiovascular: Negative for chest pain, palpitations and leg swelling.  Gastrointestinal: Positive for nausea, vomiting, abdominal pain and melena. Negative for diarrhea, constipation, blood in stool, hematochezia, abdominal distention, anal bleeding, rectal pain, anorexia and flatus.  Genitourinary: Negative.  Negative for dysuria, frequency and hematuria.  Musculoskeletal: Negative for myalgias, back pain, joint swelling, arthralgias and gait problem.  Skin: Negative for color change, pallor, rash and wound.  Neurological: Negative.  Negative for headaches.  Hematological: Negative for adenopathy. Does not bruise/bleed easily.  Psychiatric/Behavioral: Negative.        Objective:   Physical Exam  Vitals reviewed. Constitutional: She is  oriented to person, place, and time. She appears well-developed and well-nourished.  Non-toxic appearance. She does not have a sickly appearance. She does not appear ill. No distress.  HENT:  Head: Normocephalic and atraumatic.  Mouth/Throat: Oropharynx is clear and moist.  Eyes: Conjunctivae are normal. Right eye exhibits no discharge. Left eye exhibits no discharge. No scleral icterus.  Neck: Normal range of motion. Neck supple. No JVD present. No tracheal deviation present. No thyromegaly present.  Cardiovascular: Normal rate, regular rhythm, normal heart sounds and intact distal pulses.  Exam reveals no gallop and no friction rub.   No murmur heard. Pulmonary/Chest: Effort normal and breath sounds normal. No stridor. No respiratory distress. She has no wheezes. She has no rales. She exhibits no tenderness.  Abdominal: Soft. Normal appearance. She exhibits no shifting dullness, no distension, no pulsatile liver, no fluid wave, no abdominal bruit, no ascites, no pulsatile midline mass and no mass. Bowel sounds are increased. There is no hepatosplenomegaly. There is tenderness in the epigastric area. There is no rigidity, no rebound, no guarding, no CVA tenderness, no tenderness at McBurney's point and negative Murphy's sign. No hernia. Hernia confirmed negative in the ventral area.  Genitourinary: Rectal exam shows no external hemorrhoid, no internal hemorrhoid, no fissure, no mass, no tenderness and anal tone normal. Guaiac positive stool. Pelvic exam was performed with patient supine.  Musculoskeletal: Normal range of motion. She exhibits no edema and no tenderness.  Lymphadenopathy:    She has no cervical adenopathy.  Neurological: She is oriented to person, place, and time.  Skin: Skin is warm and dry. No rash noted. She is not diaphoretic. No erythema. No pallor.  Psychiatric: She has a normal mood and affect. Her behavior is normal. Judgment and thought content normal.  Lab Results    Component Value Date   WBC 7.9 06/01/2011   HGB 12.6 06/01/2011   HCT 37.5 06/01/2011   PLT 310.0 06/01/2011   GLUCOSE 96 06/01/2011   CHOL 292* 06/01/2011   TRIG 91.0 06/01/2011   HDL 127.00 06/01/2011   LDLDIRECT 146.8 06/01/2011   ALT 15 03/11/2010   AST 18 03/11/2010   NA 143 06/01/2011   K 5.7* 06/01/2011   CL 106 06/01/2011   CREATININE 1.3* 06/01/2011   BUN 27* 06/01/2011   CO2 30 06/01/2011   TSH 1.39 06/01/2011       Assessment & Plan:

## 2012-08-15 ENCOUNTER — Encounter: Payer: Self-pay | Admitting: Gastroenterology

## 2012-09-06 ENCOUNTER — Encounter: Payer: Self-pay | Admitting: Gastroenterology

## 2012-09-07 ENCOUNTER — Ambulatory Visit: Payer: Medicare Other | Admitting: Gastroenterology

## 2012-10-09 ENCOUNTER — Ambulatory Visit: Payer: Medicare Other | Admitting: Gastroenterology

## 2012-11-01 ENCOUNTER — Encounter: Payer: Self-pay | Admitting: Gastroenterology

## 2012-11-01 ENCOUNTER — Ambulatory Visit (INDEPENDENT_AMBULATORY_CARE_PROVIDER_SITE_OTHER): Payer: Medicare Other | Admitting: Gastroenterology

## 2012-11-01 VITALS — BP 122/80 | HR 68 | Ht 66.0 in | Wt 100.2 lb

## 2012-11-01 DIAGNOSIS — R1013 Epigastric pain: Secondary | ICD-10-CM

## 2012-11-01 DIAGNOSIS — F17200 Nicotine dependence, unspecified, uncomplicated: Secondary | ICD-10-CM

## 2012-11-01 DIAGNOSIS — J449 Chronic obstructive pulmonary disease, unspecified: Secondary | ICD-10-CM

## 2012-11-01 DIAGNOSIS — R195 Other fecal abnormalities: Secondary | ICD-10-CM

## 2012-11-01 DIAGNOSIS — Z8719 Personal history of other diseases of the digestive system: Secondary | ICD-10-CM

## 2012-11-01 NOTE — Patient Instructions (Addendum)
You have been given a separate informational sheet regarding your tobacco use, the importance of quitting and local resources to help you quit.  Please stay on Nexium  Your physician has requested that you go to the basement for lab work before leaving today

## 2012-11-01 NOTE — Progress Notes (Signed)
History of Present Illness:  This is a  78 year old Caucasian female heavy smoker with COPD. She recently was found to have epigastric abdominal pain related to 6-8 Aleve a day she was taking for degenerative arthritis. Apparently at that time she had guaiac positive stool and had history of a melanotic stool. She was placed on Nexium 40 mg a day with discontinuation of her NSAIDs, and is currently completely asymptomatic without abdominal pain, bowel or regularity, indigestion, dyspepsia, reflux or dysphagia. She denies lower GI or hepatobiliary complaints. In the past she is refused colonoscopy. Laboratory data showed a mild anemia with a hemoglobin of 11.7 and a serum ferritin of 38. She has marked shortness of breath with exertion and severe COPD. Family history is noncontributory. The patient also continues to use several glasses of wine a day, but denies dysuria pancreatitis or hepatitis.  I have reviewed this patient's present history, medical and surgical past history, allergies and medications.     ROS: The remainder of the 10 point ROS is negative... arthritis in her neck, shoulders, and hands.     Physical Exam: Elderly appearing patient in no acute distress. Blood pressure 130/80, pulse 102, weight 102. I cannot appreciate evidence of chronic liver disease. General well developed well nourished patient in no acute distress, appearing their stated age Eyes PERRLA, no icterus, fundoscopic exam per opthamologist Skin no lesions noted Neck supple, no adenopathy, no thyroid enlargement, no tenderness Chest clear to percussion and auscultation,,, diminished breath sounds in both lung fields noted. Heart no significant murmurs, gallops or rubs noted Abdomen no hepatosplenomegaly masses or tenderness, BS normal.  Rectal inspection normal no fissures, or fistulae noted.  No masses or tenderness on digital exam. No stool present for guaiac testing. Extremities no acute joint lesions, edema,  phlebitis or evidence of cellulitis. Neurologic patient oriented x 3, cranial nerves intact, no focal neurologic deficits noted. Psychological mental status normal and normal affect.  Assessment and plan: NSAID-induced gastric or duodenal ulceration which apparently has healed with PPI therapy. The patient is currently asymptomatic, and is not a good candidate for endoscopy or colonoscopy at her age and with her severe COPD. I have asked to return IFOB  Cards for stool exam. She is to continue Nexium indefinitely at this point. If her stool cards are positive, we'll proceed with upper GI barium studies. As per above, the patient is refused colonoscopy, and I doubt it is indicated at her age with her comorbid medical problems.  Encounter Diagnosis  Name Primary?  . Rectal bleeding Yes

## 2012-12-06 ENCOUNTER — Ambulatory Visit: Payer: Medicare Other | Admitting: Internal Medicine

## 2012-12-06 DIAGNOSIS — Z0289 Encounter for other administrative examinations: Secondary | ICD-10-CM

## 2013-01-17 ENCOUNTER — Ambulatory Visit (INDEPENDENT_AMBULATORY_CARE_PROVIDER_SITE_OTHER)
Admission: RE | Admit: 2013-01-17 | Discharge: 2013-01-17 | Disposition: A | Discharge status: Home / Self Care | Payer: Medicare Other | Source: Ambulatory Visit | Attending: Internal Medicine | Admitting: Internal Medicine

## 2013-01-17 ENCOUNTER — Ambulatory Visit (INDEPENDENT_AMBULATORY_CARE_PROVIDER_SITE_OTHER): Payer: Medicare Other | Admitting: Internal Medicine

## 2013-01-17 ENCOUNTER — Encounter: Payer: Self-pay | Admitting: Internal Medicine

## 2013-01-17 VITALS — BP 118/82 | HR 113 | Temp 98.5°F | Resp 16 | Wt 102.6 lb

## 2013-01-17 DIAGNOSIS — E785 Hyperlipidemia, unspecified: Secondary | ICD-10-CM

## 2013-01-17 DIAGNOSIS — I10 Essential (primary) hypertension: Secondary | ICD-10-CM

## 2013-01-17 DIAGNOSIS — R05 Cough: Secondary | ICD-10-CM

## 2013-01-17 DIAGNOSIS — J441 Chronic obstructive pulmonary disease with (acute) exacerbation: Secondary | ICD-10-CM

## 2013-01-17 DIAGNOSIS — R059 Cough, unspecified: Secondary | ICD-10-CM

## 2013-01-17 DIAGNOSIS — J4489 Other specified chronic obstructive pulmonary disease: Secondary | ICD-10-CM

## 2013-01-17 DIAGNOSIS — J449 Chronic obstructive pulmonary disease, unspecified: Secondary | ICD-10-CM

## 2013-01-17 MED ORDER — OLMESARTAN MEDOXOMIL 40 MG PO TABS: 40.0000 mg | ORAL_TABLET | Freq: Every day | ORAL | Status: DC

## 2013-01-17 MED ORDER — BUDESONIDE-FORMOTEROL FUMARATE 80-4.5 MCG/ACT IN AERO: 2.0000 | INHALATION_SPRAY | Freq: Two times a day (BID) | RESPIRATORY_TRACT | Status: DC

## 2013-01-17 MED ORDER — METHYLPREDNISOLONE ACETATE 80 MG/ML IJ SUSP
120.0000 mg | Freq: Once | INTRAMUSCULAR | Status: AC
  Administered 2013-01-17: 120 mg via INTRAMUSCULAR

## 2013-01-17 MED ORDER — CEFUROXIME AXETIL 500 MG PO TABS: 500.0000 mg | ORAL_TABLET | Freq: Two times a day (BID) | ORAL | Status: DC

## 2013-01-17 MED ORDER — ROSUVASTATIN CALCIUM 5 MG PO TABS: 5.0000 mg | ORAL_TABLET | Freq: Every day | ORAL | Status: DC

## 2013-01-17 NOTE — Patient Instructions (Signed)

## 2013-01-18 ENCOUNTER — Encounter: Payer: Self-pay | Admitting: Internal Medicine

## 2013-01-18 NOTE — Progress Notes (Signed)
Subjective:    Patient ID: Kaylee Li, female    DOB: 21-Feb-1930, 79 y.o.   MRN: [id number]  URI  This is a new problem. The current episode started in the past 7 days. The problem has been gradually worsening. There has been no fever. Associated symptoms include coughing and wheezing. Pertinent negatives include no abdominal pain, chest pain, congestion, diarrhea, dysuria, joint pain, joint swelling, nausea, plugged ear sensation, rash, sinus pain, sneezing, sore throat, swollen glands or vomiting. She has tried nothing for the symptoms. The treatment provided no relief.      Review of Systems  Constitutional: Negative for fever, chills, diaphoresis, activity change, appetite change, fatigue and unexpected weight change.  HENT: Negative for congestion, sore throat, facial swelling, sneezing, trouble swallowing and voice change.   Eyes: Negative.   Respiratory: Positive for cough, shortness of breath and wheezing. Negative for apnea, choking, chest tightness and stridor.   Cardiovascular: Negative for chest pain, palpitations and leg swelling.  Gastrointestinal: Negative for nausea, vomiting, abdominal pain, diarrhea, constipation, blood in stool, abdominal distention, anal bleeding and rectal pain.  Genitourinary: Negative.  Negative for dysuria.  Musculoskeletal: Negative for myalgias, back pain, joint pain, joint swelling, arthralgias and gait problem.  Skin: Negative for color change, pallor, rash and wound.  Neurological: Negative for dizziness, tremors, weakness, light-headedness and numbness.  Hematological: Negative for adenopathy. Does not bruise/bleed easily.  Psychiatric/Behavioral: Negative.        Objective:   Physical Exam  Vitals reviewed. Constitutional: She is oriented to person, place, and time. She appears well-developed and well-nourished.  Non-toxic appearance. She does not have a sickly appearance. She does not appear ill. No distress.  HENT:  Head: Normocephalic  and atraumatic.  Mouth/Throat: Oropharynx is clear and moist. No oropharyngeal exudate.  Eyes: Conjunctivae normal are normal. Right eye exhibits no discharge. Left eye exhibits no discharge. No scleral icterus.  Neck: Normal range of motion. Neck supple. No JVD present. No tracheal deviation present. No thyromegaly present.  Cardiovascular: Normal rate, regular rhythm, normal heart sounds and intact distal pulses.  Exam reveals no gallop and no friction rub.   No murmur heard. Pulmonary/Chest: Effort normal. No accessory muscle usage or stridor. Not tachypneic. No respiratory distress. She has no decreased breath sounds. She has wheezes in the right upper field and the left upper field. She has rhonchi in the right upper field, the right middle field, the left upper field and the left middle field. She has no rales. She exhibits no tenderness.  Abdominal: Soft. Bowel sounds are normal. She exhibits no distension and no mass. There is no tenderness. There is no rebound and no guarding.  Musculoskeletal: Normal range of motion. She exhibits no edema and no tenderness.  Lymphadenopathy:    She has no cervical adenopathy.  Neurological: She is oriented to person, place, and time.  Skin: Skin is warm and dry. No rash noted. She is not diaphoretic. No erythema. No pallor.  Psychiatric: She has a normal mood and affect. Her behavior is normal. Judgment and thought content normal.     Lab Results  Component Value Date   WBC 10.1 08/13/2012   HGB 11.7* 08/13/2012   HCT 35.6* 08/13/2012   PLT 359.0 08/13/2012   GLUCOSE 115* 08/13/2012   CHOL 197 08/13/2012   TRIG 75.0 08/13/2012   HDL 105.20 08/13/2012   LDLDIRECT 146.8 06/01/2011   LDLCALC 77 08/13/2012   ALT 13 08/13/2012   AST 15 08/13/2012   NA 138  08/13/2012   K 4.0 08/13/2012   CL 103 08/13/2012   CREATININE 0.9 08/13/2012   BUN 30* 08/13/2012   CO2 26 08/13/2012   TSH 1.79 08/13/2012       Assessment & Plan:

## 2013-01-18 NOTE — Assessment & Plan Note (Signed)
Her BP is well controlled 

## 2013-01-18 NOTE — Assessment & Plan Note (Signed)
I will check her CXR to see if she has pna, mass, edema 

## 2013-01-18 NOTE — Assessment & Plan Note (Signed)
I asked her to quit smoking She will start symbicort

## 2013-01-18 NOTE — Assessment & Plan Note (Signed)
Since she is wheezing she was given an injection of depo-medrol IM She has not been using tudorza so I have asked her to start symbicort She will start antibioitics with ceftin

## 2014-05-02 ENCOUNTER — Encounter: Payer: Self-pay | Admitting: Internal Medicine

## 2014-05-02 ENCOUNTER — Ambulatory Visit (INDEPENDENT_AMBULATORY_CARE_PROVIDER_SITE_OTHER): Payer: Medicare Other | Admitting: Internal Medicine

## 2014-05-02 VITALS — BP 160/98 | HR 106 | Temp 97.5°F | Wt 101.5 lb

## 2014-05-02 DIAGNOSIS — L259 Unspecified contact dermatitis, unspecified cause: Secondary | ICD-10-CM

## 2014-05-02 DIAGNOSIS — L209 Atopic dermatitis, unspecified: Secondary | ICD-10-CM

## 2014-05-02 DIAGNOSIS — L2089 Other atopic dermatitis: Secondary | ICD-10-CM

## 2014-05-02 DIAGNOSIS — I10 Essential (primary) hypertension: Secondary | ICD-10-CM

## 2014-05-02 DIAGNOSIS — L0201 Cutaneous abscess of face: Secondary | ICD-10-CM

## 2014-05-02 DIAGNOSIS — L03211 Cellulitis of face: Secondary | ICD-10-CM

## 2014-05-02 DIAGNOSIS — E785 Hyperlipidemia, unspecified: Secondary | ICD-10-CM

## 2014-05-02 MED ORDER — CEPHALEXIN 500 MG PO CAPS: 500.0000 mg | ORAL_CAPSULE | Freq: Three times a day (TID) | ORAL | Status: DC

## 2014-05-02 MED ORDER — LORATADINE 10 MG PO TABS: 10.0000 mg | ORAL_TABLET | Freq: Every day | ORAL | Status: DC

## 2014-05-02 MED ORDER — MUPIROCIN 2 % EX OINT: TOPICAL_OINTMENT | CUTANEOUS | Status: DC

## 2014-05-02 NOTE — Progress Notes (Signed)
Subjective:    Patient ID: Kaylee Li, female    DOB: 02/28/1930, 80 y.o.   MRN: [id number]  HPI  Patient is here face rash and ?sinus issues (acute on chronic in right nostril) Also reviewed chronic medical issues and interval medical events  Past Medical History  Diagnosis Date  . Hypertension   . Hyperlipidemia   . COPD (chronic obstructive pulmonary disease)   . Anemia   . Arthritis   . Emphysema of lung     Review of Systems  Constitutional: Negative for fever and fatigue.  HENT: Positive for sinus pressure (chronic). Negative for rhinorrhea and sneezing.   Respiratory: Negative for cough and shortness of breath.   Cardiovascular: Negative for chest pain and leg swelling.  Skin: Positive for rash. Negative for wound.       Objective:   Physical Exam  BP 160/98  Pulse 106  Temp(Src) 97.5 F (36.4 C) (Oral)  Wt 101 lb 8 oz (46.04 kg)  SpO2 93% Wt Readings from Last 3 Encounters:  05/02/14 101 lb 8 oz (46.04 kg)  01/17/13 102 lb 9.6 oz (46.539 kg)  11/01/12 100 lb 3.2 oz (45.45 kg)   Constitutional: She appears well-developed and well-nourished. No distress. son at side HENT: atraumatic, max sinus not significantly tender on palpation, R nare with ulceration on septum and swelling - Mildly swollen turbinates right greater than left side. Oropharynx clear without erythema, angioedema or exudate -teeth in poor repair Neck: Normal range of motion. Neck supple. No JVD or LAD present. No thyromegaly present.  Cardiovascular: Normal rate, regular rhythm and normal heart sounds.  No murmur heard. No BLE edema. Pulmonary/Chest: Effort normal and breath sounds normal. No respiratory distress. She has no wheezes.  Skin: R nose near nostril is erythematous, edematous and warm - tender to palp but no fluctuance or pustules - extends beneath B nares and upper lip - 3 separate smaller lesions below lip/chin and large confluent erythema with scaling and raised edge R forehead  extend into scalp (appears c/w eczema)  Psychiatric: She has a normal mood and affect. Her behavior is normal. Judgment and thought content normal.   Lab Results  Component Value Date   WBC 10.1 08/13/2012   HGB 11.7* 08/13/2012   HCT 35.6* 08/13/2012   PLT 359.0 08/13/2012   GLUCOSE 115* 08/13/2012   CHOL 197 08/13/2012   TRIG 75.0 08/13/2012   HDL 105.20 08/13/2012   LDLDIRECT 146.8 06/01/2011   LDLCALC 77 08/13/2012   ALT 13 08/13/2012   AST 15 08/13/2012   NA 138 08/13/2012   K 4.0 08/13/2012   CL 103 08/13/2012   CREATININE 0.9 08/13/2012   BUN 30* 08/13/2012   CO2 26 08/13/2012   TSH 1.79 08/13/2012    Dg Chest 2 View  01/17/2013   *RADIOLOGY REPORT*  Clinical Data: Cough, shortness of breath, chest pain, smoker  CHEST - 2 VIEW  Comparison: None  Findings: Minimal enlargement of cardiac silhouette. Calcified tortuous aorta. Pulmonary vascularity normal. Emphysematous changes with minimal atelectasis versus scarring at right base. Remaining lungs clear. No pleural effusion or pneumothorax. No acute osseous findings.  IMPRESSION: COPD changes with minimal atelectasis versus scarring at right base. No acute abnormalities.   Original Report Authenticated By: Lavonia Dana, M.D.       Assessment & Plan:   Skin rash,  x1 week Right nostril ulceration on septum, no evidence for sinusitis or abscess on exam  Symptoms improving with topical over-the-counter antibiotic ointment, but  not resolved  Will treat for cellulitis infection with Keflex and topical Bactroban for ulceration Also advised Claritin 10 mg daily for 2 weeks to treat atopic dermatitis vs contact dermatitis reaction -denies specific allergic trigger or contact/exposure change  Patient/son advised followup in 2 weeks if unimproved, to call sooner if worse  Problem List Items Addressed This Visit   HYPERLIPIDEMIA     On crestor The current medical regimen is effective;  continue present plan and medications.     HYPERTENSION        BP Readings from Last 3 Encounters:  05/02/14 160/98  01/17/13 118/82  11/01/12 122/80   Uncontrolled today b/c reports run out of med samples - Generally well controlled on review Provided Benicar - to recheck at home and call PCP if unimporved     Other Visit Diagnoses   Cellulitis, face    -  Primary    Atopic dermatitis        Relevant Medications       mupirocin (BACTROBAN) ointment 2%       loratadine (CLARITIN) tablet 10 mg    Contact dermatitis        Relevant Medications       mupirocin (BACTROBAN) ointment 2%       loratadine (CLARITIN) tablet 10 mg

## 2014-05-02 NOTE — Assessment & Plan Note (Signed)
BP Readings from Last 3 Encounters:  05/02/14 160/98  01/17/13 118/82  11/01/12 122/80   Uncontrolled today b/c reports run out of med samples - Generally well controlled on review Provided Benicar - to recheck at home and call PCP if unimporved

## 2014-05-02 NOTE — Assessment & Plan Note (Signed)
On crestor - The current medical regimen is effective;  continue present plan and medications.  

## 2014-05-02 NOTE — Progress Notes (Signed)
Pre visit review using our clinic review tool, if applicable. No additional management support is needed unless otherwise documented below in the visit note. 

## 2014-05-02 NOTE — Progress Notes (Signed)
Kaylee Li [id number] 05/02/2014  No chief complaint on file.   Subjective  HPI  Pt presents w/ chronic sinus issues and rash X 1 week. Also needs refills.  Chronic sinus issues and rash X 1 week: May have had an episode w/ rash in the past, but unsure. Always has sinus issues, though. Has never seen an ENT specialist for sinus issues. Rash is located on cheeks BL, nose, and right upper forehead. Is associated with congestion in right nasal passage w/ pain around right nostril. Applied abx ointment to face (neosporin off-brand), and rash has improved some. Has tried sudafed for her congestion. Has improved sxs some. Has not tried flonase, or OTC antihistamine for allergies/sinus issues.  Pertinent positives: intermittent pruritis of left cheek, erythema, edema, dryness, sneezing, nasal congestion, rhinorrhea, intermittent headache, aching pain in left nostril (5/10 on pain scale), puffy eyes, ear fullness  Pertinent negatives: cough, purulent drainage/abscess on skin, sputum production, hemoptysis, N/V/D, aura, fever, weight loss, body aches, chills, night sweats, exposure to irritants (creams, environmental allergens inside and outside) dizziness, hx of allergies, recent infection, ear pain, sore throat.   Also needs refills for benicart and crestor.   Past Medical History  Diagnosis Date  . Hypertension   . Hyperlipidemia   . COPD (chronic obstructive pulmonary disease)   . Anemia   . Arthritis   . Emphysema of lung     Past Surgical History  Procedure Laterality Date  . Abdominal hysterectomy      Family History  Problem Relation Age of Onset  . Cancer Neg Hx   . Hypertension Neg Hx   . Heart disease Neg Hx     History  Substance Use Topics  . Smoking status: Current Every Day Smoker -- 1.00 packs/day for 60 years    Types: Cigarettes  . Smokeless tobacco: Never Used  . Alcohol Use: 1.8 oz/week    3 Glasses of wine per week     Comment: every night    Current  Outpatient Prescriptions on File Prior to Visit  Medication Sig Dispense Refill  . acetaminophen (TYLENOL) 500 MG tablet Take 500 mg by mouth.      Marland Kitchen aspirin 81 MG tablet Take 81 mg by mouth daily.        . budesonide-formoterol (SYMBICORT) 80-4.5 MCG/ACT inhaler Inhale 2 puffs into the lungs 2 (two) times daily.  1 Inhaler  12  . ergocalciferol (VITAMIN D2) 50000 UNITS capsule Take 50,000 Units by mouth once a week.        . esomeprazole (NEXIUM) 40 MG capsule Take 1 capsule (40 mg total) by mouth daily.  75 capsule  0  . olmesartan (BENICAR) 40 MG tablet Take 1 tablet (40 mg total) by mouth daily.  90 tablet  3  . rosuvastatin (CRESTOR) 5 MG tablet Take 1 tablet (5 mg total) by mouth daily.  90 tablet  3   No current facility-administered medications on file prior to visit.     Allergies:No Known Allergies  Review of Systems  Constitutional: Negative for fever, chills and weight loss.  HENT: Positive for congestion. Negative for ear pain, hearing loss, sore throat and tinnitus.        Ear fullness  Eyes: Negative for blurred vision, photophobia, pain, discharge and redness.  Respiratory: Positive for cough and shortness of breath. Negative for hemoptysis, sputum production and wheezing.        SOB in the afternoon w/cough sometimes (has hx of COPD and a smoker)  Cardiovascular: Negative for chest pain.  Gastrointestinal: Negative for nausea, vomiting, abdominal pain and diarrhea.       No stool changes.  Genitourinary:       No urinary changes.  Skin: Positive for rash.  Neurological: Positive for headaches. Negative for dizziness.  Endo/Heme/Allergies: Negative for environmental allergies.       Objective  Filed Vitals:   05/02/14 1001  BP: 160/98  Pulse: 106  Temp: 97.5 F (36.4 C)  TempSrc: Oral  Weight: 101 lb 8 oz (46.04 kg)  SpO2: 93%    Physical Exam  Constitutional: She is oriented to person, place, and time. She appears well-developed and well-nourished. No  distress.  Patient has erythematous rash on face.  HENT:  Mouth/Throat: No oropharyngeal exudate.  Ears: Canals impacted w/ cerumen BL. TM's not visible. Nose-erythematous BL. R nostril w/ ulceration, tender on palpation, and dryness around nose. Throat: cobblestone appearance w/ slight erythema.  Eyes: Conjunctivae are normal. Pupils are equal, round, and reactive to light. Right eye exhibits no discharge. No scleral icterus.  Neck: Neck supple. No thyromegaly present.  Cardiovascular: Normal rate, regular rhythm and normal heart sounds.  Exam reveals no gallop and no friction rub.   No murmur heard. Respiratory: Effort normal and breath sounds normal. No respiratory distress. She has no wheezes. She has no rales.  Lymphadenopathy:    She has no cervical adenopathy.  Neurological: She is alert and oriented to person, place, and time. She has normal reflexes.  Skin: Rash noted. There is erythema.  And slight edema on face. Similar presentation on extensor surface of elbows over olecranon.  Psychiatric: She has a normal mood and affect. Her behavior is normal. Judgment and thought content normal.    BP Readings from Last 3 Encounters:  05/02/14 160/98  01/17/13 118/82  11/01/12 122/80    Wt Readings from Last 3 Encounters:  05/02/14 101 lb 8 oz (46.04 kg)  01/17/13 102 lb 9.6 oz (46.539 kg)  11/01/12 100 lb 3.2 oz (45.45 kg)    Lab Results  Component Value Date   WBC 10.1 08/13/2012   HGB 11.7* 08/13/2012   HCT 35.6* 08/13/2012   PLT 359.0 08/13/2012   GLUCOSE 115* 08/13/2012   CHOL 197 08/13/2012   TRIG 75.0 08/13/2012   HDL 105.20 08/13/2012   LDLDIRECT 146.8 06/01/2011   LDLCALC 77 08/13/2012   ALT 13 08/13/2012   AST 15 08/13/2012   NA 138 08/13/2012   K 4.0 08/13/2012   CL 103 08/13/2012   CREATININE 0.9 08/13/2012   BUN 30* 08/13/2012   CO2 26 08/13/2012   TSH 1.79 08/13/2012    Dg Chest 2 View  01/17/2013   *RADIOLOGY REPORT*  Clinical Data: Cough, shortness of breath, chest  pain, smoker  CHEST - 2 VIEW  Comparison: None  Findings: Minimal enlargement of cardiac silhouette. Calcified tortuous aorta. Pulmonary vascularity normal. Emphysematous changes with minimal atelectasis versus scarring at right base. Remaining lungs clear. No pleural effusion or pneumothorax. No acute osseous findings.  IMPRESSION: COPD changes with minimal atelectasis versus scarring at right base. No acute abnormalities.   Original Report Authenticated By: Lavonia Dana, M.D.       Assessment and Plan  Allergic rhinosinusitis w/ atopic dermatitis: Hx of chronic sinusitis, now w/ erythematous, edematous, and pruritic rash on face and extensor surface of elbows. Also presents w/ sneezing. Prescribed OTC antihistamine (claritin) to take daily for allergic rhinosinusitis. Also prescribed bactriban abx ointment for skin rash. potential cellulitis due  to presence of ulceration in right nasal passage, so prescribed Keflex abx. Told patient if sxs do not improve or get worse with current medication regimen, to please call us for different tx approach.  Refilled Crestor and Benicar.    Return if symptoms worsen or fail to improve. Berenice Bouton, Student-PA    I have personally reviewed this case with PA student. I also personally examined this patient. I agree with history and findings as documented above. I reviewed, discussed and approve of the assessment and plan as listed above. Rowe Clack, MD

## 2014-05-02 NOTE — Patient Instructions (Signed)
It was good to see you today.  We have reviewed your prior records including labs and tests today  Medications reviewed and updated  Use Keflex antibiotics for space infection, antibiotic ointment to skin as instructed and begin Claritin once daily for 2 weeks Your prescription(s) have been submitted to your pharmacy. Please take as directed and contact our office if you believe you are having problem(s) with the medication(s).  If skin rash and nose problem not improved in 2 weeks, please call for followup with Dr. Ronnald Ramp, call sooner if worse

## 2014-05-03 ENCOUNTER — Telehealth: Payer: Self-pay | Admitting: Internal Medicine

## 2014-05-03 NOTE — Telephone Encounter (Signed)
Relevant patient education mailed to patient.  

## 2014-05-19 ENCOUNTER — Encounter (HOSPITAL_COMMUNITY): Payer: Self-pay | Admitting: Emergency Medicine

## 2014-05-19 ENCOUNTER — Emergency Department (HOSPITAL_COMMUNITY)
Admission: EM | Admit: 2014-05-19 | Discharge: 2014-05-19 | Disposition: A | Discharge status: Home / Self Care | Payer: Medicare Other | Attending: Emergency Medicine | Admitting: Emergency Medicine

## 2014-05-19 DIAGNOSIS — F17200 Nicotine dependence, unspecified, uncomplicated: Secondary | ICD-10-CM | POA: Diagnosis not present

## 2014-05-19 DIAGNOSIS — E785 Hyperlipidemia, unspecified: Secondary | ICD-10-CM | POA: Insufficient documentation

## 2014-05-19 DIAGNOSIS — R21 Rash and other nonspecific skin eruption: Secondary | ICD-10-CM

## 2014-05-19 DIAGNOSIS — Z7982 Long term (current) use of aspirin: Secondary | ICD-10-CM | POA: Insufficient documentation

## 2014-05-19 DIAGNOSIS — I10 Essential (primary) hypertension: Secondary | ICD-10-CM | POA: Diagnosis not present

## 2014-05-19 DIAGNOSIS — L03211 Cellulitis of face: Secondary | ICD-10-CM

## 2014-05-19 DIAGNOSIS — Z792 Long term (current) use of antibiotics: Secondary | ICD-10-CM | POA: Diagnosis not present

## 2014-05-19 DIAGNOSIS — L0201 Cutaneous abscess of face: Secondary | ICD-10-CM | POA: Diagnosis not present

## 2014-05-19 DIAGNOSIS — M129 Arthropathy, unspecified: Secondary | ICD-10-CM | POA: Insufficient documentation

## 2014-05-19 DIAGNOSIS — Z79899 Other long term (current) drug therapy: Secondary | ICD-10-CM | POA: Insufficient documentation

## 2014-05-19 DIAGNOSIS — J3489 Other specified disorders of nose and nasal sinuses: Secondary | ICD-10-CM | POA: Diagnosis not present

## 2014-05-19 DIAGNOSIS — J438 Other emphysema: Secondary | ICD-10-CM | POA: Insufficient documentation

## 2014-05-19 DIAGNOSIS — Z862 Personal history of diseases of the blood and blood-forming organs and certain disorders involving the immune mechanism: Secondary | ICD-10-CM | POA: Diagnosis not present

## 2014-05-19 MED ORDER — SULFAMETHOXAZOLE-TMP DS 800-160 MG PO TABS: 1.0000 | ORAL_TABLET | Freq: Two times a day (BID) | ORAL | Status: DC

## 2014-05-19 NOTE — ED Notes (Signed)
MD at bedside. 

## 2014-05-19 NOTE — Discharge Instructions (Signed)
Cellulitis Cellulitis is an infection of the skin and the tissue beneath it. The infected area is usually red and tender. Cellulitis occurs most often in the arms and lower legs.  CAUSES  Cellulitis is caused by bacteria that enter the skin through cracks or cuts in the skin. The most common types of bacteria that cause cellulitis are Staphylococcus and Streptococcus. SYMPTOMS   Redness and warmth.  Swelling.  Tenderness or pain.  Fever. DIAGNOSIS  Your caregiver can usually determine what is wrong based on a physical exam. Blood tests may also be done. TREATMENT  Treatment usually involves taking an antibiotic medicine. HOME CARE INSTRUCTIONS   Take your antibiotics as directed. Finish them even if you start to feel better.  Keep the infected arm or leg elevated to reduce swelling.  Apply a warm cloth to the affected area up to 4 times per day to relieve pain.  Only take over-the-counter or prescription medicines for pain, discomfort, or fever as directed by your caregiver.  Keep all follow-up appointments as directed by your caregiver. SEEK MEDICAL CARE IF:   You notice red streaks coming from the infected area.  Your red area gets larger or turns dark in color.  Your bone or joint underneath the infected area becomes painful after the skin has healed.  Your infection returns in the same area or another area.  You notice a swollen bump in the infected area.  You develop new symptoms. SEEK IMMEDIATE MEDICAL CARE IF:   You have a fever.  You feel very sleepy.  You develop vomiting or diarrhea.  You have a general ill feeling (malaise) with muscle aches and pains. MAKE SURE YOU:   Understand these instructions.  Will watch your condition.  Will get help right away if you are not doing well or get worse. Document Released: 09/21/2005 Document Revised: 06/12/2012 Document Reviewed: 02/27/2012 [employer] Patient Information 2014 [employer].  

## 2014-05-19 NOTE — ED Provider Notes (Signed)
CSN: [id number]     Arrival date & time 05/19/14  6948 History   First MD Initiated Contact with Patient 05/19/14 [id number]     Chief Complaint  Patient presents with  . Rash     (Consider location/radiation/quality/duration/timing/severity/associated sxs/prior Treatment) Patient is a 80 y.o. female presenting with rash.  Rash Location:  Face Facial rash location:  L cheek, R cheek, lip, nose and forehead Quality: dryness, itchiness, painful, peeling, redness, scaling and swelling   Quality: not bruising, not burning and not weeping   Pain details:    Quality:  Aching   Severity:  Moderate   Onset quality:  Gradual   Duration:  2 weeks   Timing:  Constant   Progression:  Worsening Severity:  Moderate Onset quality:  Gradual Context: not exposure to similar rash, not medications, not new detergent/soap and not sick contacts   Relieved by:  Nothing Worsened by:  Nothing tried Ineffective treatments: PO keflex course, neosporin ointment, bactroban ointment. Associated symptoms: no abdominal pain, no fever, no nausea, no periorbital edema, no shortness of breath, no throat swelling, no URI and not vomiting     Past Medical History  Diagnosis Date  . Hypertension   . Hyperlipidemia   . COPD (chronic obstructive pulmonary disease)   . Anemia   . Arthritis   . Emphysema of lung    Past Surgical History  Procedure Laterality Date  . Abdominal hysterectomy     Family History  Problem Relation Age of Onset  . Cancer Neg Hx   . Hypertension Neg Hx   . Heart disease Neg Hx    History  Substance Use Topics  . Smoking status: Current Every Day Smoker -- 1.00 packs/day for 60 years    Types: Cigarettes  . Smokeless tobacco: Never Used  . Alcohol Use: 1.8 oz/week    3 Glasses of wine per week     Comment: every night   OB History   Grav Para Term Preterm Abortions TAB SAB Ect Mult Living                 Review of Systems  Constitutional: Negative for fever.  HENT:  Positive for rhinorrhea and sinus pressure.   Respiratory: Negative for shortness of breath.   Gastrointestinal: Negative for nausea, vomiting and abdominal pain.  Skin: Positive for rash.  All other systems reviewed and are negative.     Allergies  Review of patient's allergies indicates no known allergies.  Home Medications   Prior to Admission medications   Medication Sig Start Date End Date Taking? Authorizing Provider  acetaminophen (TYLENOL) 500 MG tablet Take 1,000 mg by mouth 3 (three) times daily as needed for mild pain.    Yes Historical Provider, MD  aspirin 81 MG tablet Take 81 mg by mouth daily.     Yes Historical Provider, MD  loratadine (CLARITIN) 10 MG tablet Take 1 tablet (10 mg total) by mouth daily. 05/02/14  Yes Rowe Clack, MD  mupirocin ointment (BACTROBAN) 2 % Apply to affected area 3 times daily as needed 05/02/14  Yes Rowe Clack, MD  olmesartan (BENICAR) 40 MG tablet Take 1 tablet (40 mg total) by mouth daily. 01/17/13  Yes Janith Lima, MD  rosuvastatin (CRESTOR) 5 MG tablet Take 1 tablet (5 mg total) by mouth daily. 01/17/13  Yes Janith Lima, MD  cephALEXin (KEFLEX) 500 MG capsule Take 1 capsule (500 mg total) by mouth 3 (three) times daily. 05/02/14  Rowe Clack, MD  esomeprazole (NEXIUM) 40 MG capsule Take 1 capsule (40 mg total) by mouth daily. 08/13/12 05/02/14  Janith Lima, MD  sulfamethoxazole-trimethoprim (BACTRIM DS) 800-160 MG per tablet Take 1 tablet by mouth 2 (two) times daily. 05/19/14   Osvaldo Shipper, MD   BP 158/74  Pulse 104  Temp(Src) 98.4 F (36.9 C) (Oral)  Resp 16  SpO2 96% Physical Exam  Nursing note and vitals reviewed. Constitutional: She is oriented to person, place, and time. She appears well-developed and well-nourished. No distress.  HENT:  Head: Normocephalic and atraumatic.    Rash on areas shown in picture. Rash with crusting/scaling and underlying erythema. Skin with very mild induration. No  weeping, no vesicles, no drainage.  Eyes: EOM are normal. Pupils are equal, round, and reactive to light.  Neck: Normal range of motion. Neck supple.  Cardiovascular: Normal rate and regular rhythm.  Exam reveals no friction rub.   No murmur heard. Pulmonary/Chest: Effort normal and breath sounds normal. No respiratory distress. She has no wheezes. She has no rales.  Abdominal: Soft. She exhibits no distension. There is no tenderness. There is no rebound.  Musculoskeletal: Normal range of motion. She exhibits no edema.  Neurological: She is alert and oriented to person, place, and time. No cranial nerve deficit. She exhibits normal muscle tone. Coordination normal.  Skin: No rash noted. She is not diaphoretic.    ED Course  Procedures (including critical care time) Labs Review Labs Reviewed - No data to display  Imaging Review No results found.   EKG Interpretation None      MDM   Final diagnoses:  Facial cellulitis  Facial rash    80 year old female presents with a facial rash. Seen by her regular Dr. for this 2 weeks ago. Was put on Keflex and instructed to use Bactroban for concerns of cellulitis of the face. Despite these therapies, has worsened. She is not having much scaling to her face. She does have history of psoriasis, however it was only one small patch behind her ear one time. Note from the PCP states concern for atopic dermatitis. I am concerned for a dermatitis also, possible psoriasis versus atopic dermatitis due to the scaling nature of the rash. I'm also concerned for mild superinfection. I will put on Bactrim for this. I instructed patient to followup with PCP for possible steroids. On exam she has scaling to the right upper part of her forehead which extends into the hairline. Also scaling rash on the upper lip, left medial cheek, mildly on the right medial cheek. There is some redness underlying with mild blanching and mild induration of the skin, which makes me  concern for superinfection. Patient has no systemic symptoms and is otherwise stable for discharge.    Osvaldo Shipper, MD 05/19/14 [phone]

## 2014-05-19 NOTE — ED Notes (Signed)
Pt was given cream for rash on face about 1 week ago, pt present with worsening rash on face that is scabing up and painful and red.

## 2014-05-23 ENCOUNTER — Ambulatory Visit: Payer: Medicare Other | Admitting: Internal Medicine

## 2014-05-26 ENCOUNTER — Other Ambulatory Visit: Payer: Self-pay | Admitting: Internal Medicine

## 2014-05-27 ENCOUNTER — Other Ambulatory Visit: Payer: Self-pay | Admitting: Internal Medicine

## 2014-06-02 ENCOUNTER — Ambulatory Visit (INDEPENDENT_AMBULATORY_CARE_PROVIDER_SITE_OTHER): Payer: Medicare Other | Admitting: Internal Medicine

## 2014-06-02 ENCOUNTER — Encounter: Payer: Self-pay | Admitting: Internal Medicine

## 2014-06-02 ENCOUNTER — Other Ambulatory Visit (INDEPENDENT_AMBULATORY_CARE_PROVIDER_SITE_OTHER): Payer: Medicare Other

## 2014-06-02 VITALS — BP 140/84 | HR 80 | Temp 98.4°F | Resp 16 | Ht 66.0 in | Wt 102.8 lb

## 2014-06-02 DIAGNOSIS — E559 Vitamin D deficiency, unspecified: Secondary | ICD-10-CM

## 2014-06-02 DIAGNOSIS — R739 Hyperglycemia, unspecified: Secondary | ICD-10-CM | POA: Insufficient documentation

## 2014-06-02 DIAGNOSIS — I10 Essential (primary) hypertension: Secondary | ICD-10-CM

## 2014-06-02 DIAGNOSIS — D649 Anemia, unspecified: Secondary | ICD-10-CM

## 2014-06-02 DIAGNOSIS — M810 Age-related osteoporosis without current pathological fracture: Secondary | ICD-10-CM

## 2014-06-02 DIAGNOSIS — E785 Hyperlipidemia, unspecified: Secondary | ICD-10-CM

## 2014-06-02 DIAGNOSIS — R7309 Other abnormal glucose: Secondary | ICD-10-CM

## 2014-06-02 DIAGNOSIS — L219 Seborrheic dermatitis, unspecified: Secondary | ICD-10-CM | POA: Insufficient documentation

## 2014-06-02 DIAGNOSIS — E538 Deficiency of other specified B group vitamins: Secondary | ICD-10-CM | POA: Insufficient documentation

## 2014-06-02 DIAGNOSIS — L270 Generalized skin eruption due to drugs and medicaments taken internally: Secondary | ICD-10-CM | POA: Insufficient documentation

## 2014-06-02 DIAGNOSIS — Z0000 Encounter for general adult medical examination without abnormal findings: Secondary | ICD-10-CM

## 2014-06-02 LAB — CBC WITH DIFFERENTIAL/PLATELET
BASOS ABS: 0 10*3/uL (ref 0.0–0.1)
Basophils Relative: 0.3 % (ref 0.0–3.0)
EOS ABS: 0.2 10*3/uL (ref 0.0–0.7)
Eosinophils Relative: 1.8 % (ref 0.0–5.0)
HEMATOCRIT: 38.5 % (ref 36.0–46.0)
HEMOGLOBIN: 12.6 g/dL (ref 12.0–15.0)
LYMPHS ABS: 1.6 10*3/uL (ref 0.7–4.0)
Lymphocytes Relative: 18.9 % (ref 12.0–46.0)
MCHC: 32.8 g/dL (ref 30.0–36.0)
MCV: 93.1 fl (ref 78.0–100.0)
Monocytes Absolute: 0.8 10*3/uL (ref 0.1–1.0)
Monocytes Relative: 9.5 % (ref 3.0–12.0)
NEUTROS ABS: 5.9 10*3/uL (ref 1.4–7.7)
Neutrophils Relative %: 69.5 % (ref 43.0–77.0)
Platelets: 324 10*3/uL (ref 150.0–400.0)
RBC: 4.14 Mil/uL (ref 3.87–5.11)
RDW: 13.6 % (ref 11.5–15.5)
WBC: 8.4 10*3/uL (ref 4.0–10.5)

## 2014-06-02 LAB — LIPID PANEL
Cholesterol: 229 mg/dL — ABNORMAL HIGH (ref 0–200)
HDL: 98 mg/dL (ref >39.00)
LDL Cholesterol: 102 mg/dL — ABNORMAL HIGH (ref 0–99)
NonHDL: 131
TRIGLYCERIDES: 144 mg/dL (ref 0.0–149.0)
Total CHOL/HDL Ratio: 2
VLDL: 28.8 mg/dL (ref 0.0–40.0)

## 2014-06-02 LAB — COMPREHENSIVE METABOLIC PANEL
ALK PHOS: 64 U/L (ref 39–117)
ALT: 16 U/L (ref 0–35)
AST: 16 U/L (ref 0–37)
Albumin: 3.7 g/dL (ref 3.5–5.2)
BILIRUBIN TOTAL: 0.1 mg/dL — AB (ref 0.2–1.2)
BUN: 19 mg/dL (ref 6–23)
CO2: 25 mEq/L (ref 19–32)
CREATININE: 0.8 mg/dL (ref 0.4–1.2)
Calcium: 9 mg/dL (ref 8.4–10.5)
Chloride: 105 mEq/L (ref 96–112)
GFR: 70.91 mL/min (ref >60.00)
Glucose, Bld: 117 mg/dL — ABNORMAL HIGH (ref 70–99)
Potassium: 4.5 mEq/L (ref 3.5–5.1)
Sodium: 138 mEq/L (ref 135–145)
Total Protein: 6.5 g/dL (ref 6.0–8.3)

## 2014-06-02 LAB — IBC PANEL
Iron: 62 ug/dL (ref 42–145)
SATURATION RATIOS: 21.7 % (ref 20.0–50.0)
TRANSFERRIN: 203.7 mg/dL — AB (ref 212.0–360.0)

## 2014-06-02 LAB — TSH: TSH: 1.4 u[IU]/mL (ref 0.35–4.50)

## 2014-06-02 LAB — VITAMIN D 25 HYDROXY (VIT D DEFICIENCY, FRACTURES): VITD: 13.51 ng/mL

## 2014-06-02 LAB — FOLATE: FOLATE: 14.4 ng/mL (ref >5.9)

## 2014-06-02 LAB — VITAMIN B12: Vitamin B-12: 187 pg/mL — ABNORMAL LOW (ref 211–911)

## 2014-06-02 LAB — HEMOGLOBIN A1C: Hgb A1c MFr Bld: 5.6 % (ref 4.6–6.5)

## 2014-06-02 LAB — FERRITIN: Ferritin: 64 ng/mL (ref 10.0–291.0)

## 2014-06-02 MED ORDER — KETOCONAZOLE 2 % EX CREA: 1.0000 "application " | TOPICAL_CREAM | Freq: Two times a day (BID) | CUTANEOUS | Status: DC

## 2014-06-02 MED ORDER — OLMESARTAN MEDOXOMIL 40 MG PO TABS: 40.0000 mg | ORAL_TABLET | Freq: Every day | ORAL | Status: DC

## 2014-06-02 MED ORDER — METHYLPREDNISOLONE ACETATE 80 MG/ML IJ SUSP
80.0000 mg | Freq: Once | INTRAMUSCULAR | Status: AC
  Administered 2014-06-02: 80 mg via INTRAMUSCULAR

## 2014-06-02 MED ORDER — CHOLECALCIFEROL 1.25 MG (50000 UT) PO TABS: 1.0000 | ORAL_TABLET | ORAL | Status: DC

## 2014-06-02 MED ORDER — DESONIDE 0.05 % EX LOTN: TOPICAL_LOTION | Freq: Two times a day (BID) | CUTANEOUS | Status: DC

## 2014-06-02 MED ORDER — ROSUVASTATIN CALCIUM 5 MG PO TABS: 5.0000 mg | ORAL_TABLET | Freq: Every day | ORAL | Status: DC

## 2014-06-02 NOTE — Assessment & Plan Note (Signed)
Her BP is well controlled I will monitor her lytes and renal function today 

## 2014-06-02 NOTE — Assessment & Plan Note (Addendum)

## 2014-06-02 NOTE — Assessment & Plan Note (Signed)
I will recheck her CBC and will look at her vitamin levels as well

## 2014-06-02 NOTE — Patient Instructions (Signed)
Preventive Care for Adults, Female A healthy lifestyle and preventive care can promote health and wellness. Preventive health guidelines for women include the following key practices.  A routine yearly physical is a good way to check with your health care provider about your health and preventive screening. It is a chance to share any concerns and updates on your health and to receive a thorough exam.  Visit your dentist for a routine exam and preventive care every 6 months. Brush your teeth twice a day and floss once a day. Good oral hygiene prevents tooth decay and gum disease.  The frequency of eye exams is based on your age, health, family medical history, use of contact lenses, and other factors. Follow your health care provider's recommendations for frequency of eye exams.  Eat a healthy diet. Foods like vegetables, fruits, whole grains, low-fat dairy products, and lean protein foods contain the nutrients you need without too many calories. Decrease your intake of foods high in solid fats, added sugars, and salt. Eat the right amount of calories for you.Get information about a proper diet from your health care provider, if necessary.  Regular physical exercise is one of the most important things you can do for your health. Most adults should get at least 150 minutes of moderate-intensity exercise (any activity that increases your heart rate and causes you to sweat) each week. In addition, most adults need muscle-strengthening exercises on 2 or more days a week.  Maintain a healthy weight. The body mass index (BMI) is a screening tool to identify possible weight problems. It provides an estimate of body fat based on height and weight. Your health care provider can find your BMI, and can help you achieve or maintain a healthy weight.For adults 20 years and older:  A BMI below 18.5 is considered underweight.  A BMI of 18.5 to 24.9 is normal.  A BMI of 25 to 29.9 is considered overweight.  A  BMI of 30 and above is considered obese.  Maintain normal blood lipids and cholesterol levels by exercising and minimizing your intake of saturated fat. Eat a balanced diet with plenty of fruit and vegetables. Blood tests for lipids and cholesterol should begin at age 62 and be repeated every 5 years. If your lipid or cholesterol levels are high, you are over 50, or you are at high risk for heart disease, you may need your cholesterol levels checked more frequently.Ongoing high lipid and cholesterol levels should be treated with medicines if diet and exercise are not working.  If you smoke, find out from your health care provider how to quit. If you do not use tobacco, do not start.  Lung cancer screening is recommended for adults aged 36 80 years who are at high risk for developing lung cancer because of a history of smoking. A yearly low-dose CT scan of the lungs is recommended for people who have at least a 30-pack-year history of smoking and are a current smoker or have quit within the past 15 years. A pack year of smoking is smoking an average of 1 pack of cigarettes a day for 1 year (for example: 1 pack a day for 30 years or 2 packs a day for 15 years). Yearly screening should continue until the smoker has stopped smoking for at least 15 years. Yearly screening should be stopped for people who develop a health problem that would prevent them from having lung cancer treatment.  If you are pregnant, do not drink alcohol. If you  are breastfeeding, be very cautious about drinking alcohol. If you are not pregnant and choose to drink alcohol, do not have more than 1 drink per day. One drink is considered to be 12 ounces (355 mL) of beer, 5 ounces (148 mL) of wine, or 1.5 ounces (44 mL) of liquor.  Avoid use of street drugs. Do not share needles with anyone. Ask for help if you need support or instructions about stopping the use of drugs.  High blood pressure causes heart disease and increases the risk  of stroke. Your blood pressure should be checked at least every 1 to 2 years. Ongoing high blood pressure should be treated with medicines if weight loss and exercise do not work.  If you are 39 78 years old, ask your health care provider if you should take aspirin to prevent strokes.  Diabetes screening involves taking a blood sample to check your fasting blood sugar level. This should be done once every 3 years, after age 56, if you are within normal weight and without risk factors for diabetes. Testing should be considered at a younger age or be carried out more frequently if you are overweight and have at least 1 risk factor for diabetes.  Breast cancer screening is essential preventive care for women. You should practice "breast self-awareness." This means understanding the normal appearance and feel of your breasts and may include breast self-examination. Any changes detected, no matter how small, should be reported to a health care provider. Women in their 40s and 30s should have a clinical breast exam (CBE) by a health care provider as part of a regular health exam every 1 to 3 years. After age 28, women should have a CBE every year. Starting at age 72, women should consider having a mammogram (breast X-ray test) every year. Women who have a family history of breast cancer should talk to their health care provider about genetic screening. Women at a high risk of breast cancer should talk to their health care providers about having an MRI and a mammogram every year.  Breast cancer gene (BRCA)-related cancer risk assessment is recommended for women who have family members with BRCA-related cancers. BRCA-related cancers include breast, ovarian, tubal, and peritoneal cancers. Having family members with these cancers may be associated with an increased risk for harmful changes (mutations) in the breast cancer genes BRCA1 and BRCA2. Results of the assessment will determine the need for genetic counseling  and BRCA1 and BRCA2 testing.  The Pap test is a screening test for cervical cancer. A Pap test can show cell changes on the cervix that might become cervical cancer if left untreated. A Pap test is a procedure in which cells are obtained and examined from the lower end of the uterus (cervix).  Women should have a Pap test starting at age 59.  Between ages 42 and 13, Pap tests should be repeated every 2 years.  Beginning at age 53, you should have a Pap test every 3 years as long as the past 3 Pap tests have been normal.  Some women have medical problems that increase the chance of getting cervical cancer. Talk to your health care provider about these problems. It is especially important to talk to your health care provider if a new problem develops soon after your last Pap test. In these cases, your health care provider may recommend more frequent screening and Pap tests.  The above recommendations are the same for women who have or have not gotten the vaccine  for human papillomavirus (HPV).  If you had a hysterectomy for a problem that was not cancer or a condition that could lead to cancer, then you no longer need Pap tests. Even if you no longer need a Pap test, a regular exam is a good idea to make sure no other problems are starting.  If you are between ages 58 and 10 years, and you have had normal Pap tests going back 10 years, you no longer need Pap tests. Even if you no longer need a Pap test, a regular exam is a good idea to make sure no other problems are starting.  If you have had past treatment for cervical cancer or a condition that could lead to cancer, you need Pap tests and screening for cancer for at least 20 years after your treatment.  If Pap tests have been discontinued, risk factors (such as a new sexual partner) need to be reassessed to determine if screening should be resumed.  The HPV test is an additional test that may be used for cervical cancer screening. The HPV test  looks for the virus that can cause the cell changes on the cervix. The cells collected during the Pap test can be tested for HPV. The HPV test could be used to screen women aged 67 years and older, and should be used in women of any age who have unclear Pap test results. After the age of 65, women should have HPV testing at the same frequency as a Pap test.  Colorectal cancer can be detected and often prevented. Most routine colorectal cancer screening begins at the age of 25 years and continues through age 66 years. However, your health care provider may recommend screening at an earlier age if you have risk factors for colon cancer. On a yearly basis, your health care provider may provide home test kits to check for hidden blood in the stool. Use of a small camera at the end of a tube, to directly examine the colon (sigmoidoscopy or colonoscopy), can detect the earliest forms of colorectal cancer. Talk to your health care provider about this at age 79, when routine screening begins. Direct exam of the colon should be repeated every 5 10 years through age 47 years, unless early forms of pre-cancerous polyps or small growths are found.  People who are at an increased risk for hepatitis B should be screened for this virus. You are considered at high risk for hepatitis B if:  You were born in a country where hepatitis B occurs often. Talk with your health care provider about which countries are considered high risk.  Your parents were born in a high-risk country and you have not received a shot to protect against hepatitis B (hepatitis B vaccine).  You have HIV or AIDS.  You use needles to inject street drugs.  You live with, or have sex with, someone who has Hepatitis B.  You get hemodialysis treatment.  You take certain medicines for conditions like cancer, organ transplantation, and autoimmune conditions.  Hepatitis C blood testing is recommended for all people born from [birth year] through 1965 and  any individual with known risks for hepatitis C.  Practice safe sex. Use condoms and avoid high-risk sexual practices to reduce the spread of sexually transmitted infections (STIs). STIs include gonorrhea, chlamydia, syphilis, trichomonas, herpes, HPV, and human immunodeficiency virus (HIV). Herpes, HIV, and HPV are viral illnesses that have no cure. They can result in disability, cancer, and death. Sexually active women aged 66  years and younger should be checked for chlamydia. Older women with new or multiple partners should also be tested for chlamydia. Testing for other STIs is recommended if you are sexually active and at increased risk.  Osteoporosis is a disease in which the bones lose minerals and strength with aging. This can result in serious bone fractures or breaks. The risk of osteoporosis can be identified using a bone density scan. Women ages 18 years and over and women at risk for fractures or osteoporosis should discuss screening with their health care providers. Ask your health care provider whether you should take a calcium supplement or vitamin D to reduce the rate of osteoporosis.  Menopause can be associated with physical symptoms and risks. Hormone replacement therapy is available to decrease symptoms and risks. You should talk to your health care provider about whether hormone replacement therapy is right for you.  Use sunscreen. Apply sunscreen liberally and repeatedly throughout the day. You should seek shade when your shadow is shorter than you. Protect yourself by wearing long sleeves, pants, a wide-brimmed hat, and sunglasses year round, whenever you are outdoors.  Once a month, do a whole body skin exam, using a mirror to look at the skin on your back. Tell your health care provider of new moles, moles that have irregular borders, moles that are larger than a pencil eraser, or moles that have changed in shape or color.  Stay current with required vaccines  (immunizations).  Influenza vaccine. All adults should be immunized every year.  Tetanus, diphtheria, and acellular pertussis (Td, Tdap) vaccine. Pregnant women should receive 1 dose of Tdap vaccine during each pregnancy. The dose should be obtained regardless of the length of time since the last dose. Immunization is preferred during the 27th 36th week of gestation. An adult who has not previously received Tdap or who does not know her vaccine status should receive 1 dose of Tdap. This initial dose should be followed by tetanus and diphtheria toxoids (Td) booster doses every 10 years. Adults with an unknown or incomplete history of completing a 3-dose immunization series with Td-containing vaccines should begin or complete a primary immunization series including a Tdap dose. Adults should receive a Td booster every 10 years.  Varicella vaccine. An adult without evidence of immunity to varicella should receive 2 doses or a second dose if she has previously received 1 dose. Pregnant females who do not have evidence of immunity should receive the first dose after pregnancy. This first dose should be obtained before leaving the health care facility. The second dose should be obtained 4 8 weeks after the first dose.  Human papillomavirus (HPV) vaccine. Females aged 9 26 years who have not received the vaccine previously should obtain the 3-dose series. The vaccine is not recommended for use in pregnant females. However, pregnancy testing is not needed before receiving a dose. If a female is found to be pregnant after receiving a dose, no treatment is needed. In that case, the remaining doses should be delayed until after the pregnancy. Immunization is recommended for any person with an immunocompromised condition through the age of 51 years if she did not get any or all doses earlier. During the 3-dose series, the second dose should be obtained 4 8 weeks after the first dose. The third dose should be obtained  24 weeks after the first dose and 16 weeks after the second dose.  Zoster vaccine. One dose is recommended for adults aged 57 years or older unless certain  conditions are present.  Measles, mumps, and rubella (MMR) vaccine. Adults born before [birth year] generally are considered immune to measles and mumps. Adults born in [birth year] or later should have 1 or more doses of MMR vaccine unless there is a contraindication to the vaccine or there is laboratory evidence of immunity to each of the three diseases. A routine second dose of MMR vaccine should be obtained at least 28 days after the first dose for students attending postsecondary schools, health care workers, or international travelers. People who received inactivated measles vaccine or an unknown type of measles vaccine during 1963 1967 should receive 2 doses of MMR vaccine. People who received inactivated mumps vaccine or an unknown type of mumps vaccine before 1979 and are at high risk for mumps infection should consider immunization with 2 doses of MMR vaccine. For females of childbearing age, rubella immunity should be determined. If there is no evidence of immunity, females who are not pregnant should be vaccinated. If there is no evidence of immunity, females who are pregnant should delay immunization until after pregnancy. Unvaccinated health care workers born before [birth year] who lack laboratory evidence of measles, mumps, or rubella immunity or laboratory confirmation of disease should consider measles and mumps immunization with 2 doses of MMR vaccine or rubella immunization with 1 dose of MMR vaccine.  Pneumococcal 13-valent conjugate (PCV13) vaccine. When indicated, a person who is uncertain of her immunization history and has no record of immunization should receive the PCV13 vaccine. An adult aged 42 years or older who has certain medical conditions and has not been previously immunized should receive 1 dose of PCV13 vaccine. This PCV13 should be followed  with a dose of pneumococcal polysaccharide (PPSV23) vaccine. The PPSV23 vaccine dose should be obtained at least 8 weeks after the dose of PCV13 vaccine. An adult aged 4 years or older who has certain medical conditions and previously received 1 or more doses of PPSV23 vaccine should receive 1 dose of PCV13. The PCV13 vaccine dose should be obtained 1 or more years after the last PPSV23 vaccine dose.  Pneumococcal polysaccharide (PPSV23) vaccine. When PCV13 is also indicated, PCV13 should be obtained first. All adults aged 27 years and older should be immunized. An adult younger than age 33 years who has certain medical conditions should be immunized. Any person who resides in a nursing home or long-term care facility should be immunized. An adult smoker should be immunized. People with an immunocompromised condition and certain other conditions should receive both PCV13 and PPSV23 vaccines. People with human immunodeficiency virus (HIV) infection should be immunized as soon as possible after diagnosis. Immunization during chemotherapy or radiation therapy should be avoided. Routine use of PPSV23 vaccine is not recommended for American Indians, [place] Natives, or people younger than 65 years unless there are medical conditions that require PPSV23 vaccine. When indicated, people who have unknown immunization and have no record of immunization should receive PPSV23 vaccine. One-time revaccination 5 years after the first dose of PPSV23 is recommended for people aged 13 64 years who have chronic kidney failure, nephrotic syndrome, asplenia, or immunocompromised conditions. People who received 1 2 doses of PPSV23 before age 66 years should receive another dose of PPSV23 vaccine at age 27 years or later if at least 5 years have passed since the previous dose. Doses of PPSV23 are not needed for people immunized with PPSV23 at or after age 33 years.  Meningococcal vaccine. Adults with asplenia or persistent complement  component deficiencies should receive 2  doses of quadrivalent meningococcal conjugate (MenACWY-D) vaccine. The doses should be obtained at least 2 months apart. Microbiologists working with certain meningococcal bacteria, [place] recruits, people at risk during an outbreak, and people who travel to or live in countries with a high rate of meningitis should be immunized. A first-year college student up through age 49 years who is living in a residence hall should receive a dose if she did not receive a dose on or after her 16th birthday. Adults who have certain high-risk conditions should receive one or more doses of vaccine.  Hepatitis A vaccine. Adults who wish to be protected from this disease, have certain high-risk conditions, work with hepatitis A-infected animals, work in hepatitis A research labs, or travel to or work in countries with a high rate of hepatitis A should be immunized. Adults who were previously unvaccinated and who anticipate close contact with an international adoptee during the first 60 days after arrival in the [place] from a country with a high rate of hepatitis A should be immunized.  Hepatitis B vaccine. Adults who wish to be protected from this disease, have certain high-risk conditions, may be exposed to blood or other infectious body fluids, are household contacts or sex partners of hepatitis B positive people, are clients or workers in certain care facilities, or travel to or work in countries with a high rate of hepatitis B should be immunized.  Haemophilus influenzae type b (Hib) vaccine. A previously unvaccinated person with asplenia or sickle cell disease or having a scheduled splenectomy should receive 1 dose of Hib vaccine. Regardless of previous immunization, a recipient of a hematopoietic stem cell transplant should receive a 3-dose series 6 12 months after her successful transplant. Hib vaccine is not recommended for adults with HIV infection. Preventive  Services / Frequency Ages 24 to 39years  Blood pressure check.** / Every 1 to 2 years.  Lipid and cholesterol check.** / Every 5 years beginning at age 66.  Clinical breast exam.** / Every 3 years for women in their 12s and 24s.  BRCA-related cancer risk assessment.** / For women who have family members with a BRCA-related cancer (breast, ovarian, tubal, or peritoneal cancers).  Pap test.** / Every 2 years from ages 31 through 69. Every 3 years starting at age 64 through age 76 or 89 with a history of 3 consecutive normal Pap tests.  HPV screening.** / Every 3 years from ages 10 through ages 10 to 96 with a history of 3 consecutive normal Pap tests.  Hepatitis C blood test.** / For any individual with known risks for hepatitis C.  Skin self-exam. / Monthly.  Influenza vaccine. / Every year.  Tetanus, diphtheria, and acellular pertussis (Tdap, Td) vaccine.** / Consult your health care provider. Pregnant women should receive 1 dose of Tdap vaccine during each pregnancy. 1 dose of Td every 10 years.  Varicella vaccine.** / Consult your health care provider. Pregnant females who do not have evidence of immunity should receive the first dose after pregnancy.  HPV vaccine. / 3 doses over 6 months, if 90 and younger. The vaccine is not recommended for use in pregnant females. However, pregnancy testing is not needed before receiving a dose.  Measles, mumps, rubella (MMR) vaccine.** / You need at least 1 dose of MMR if you were born in 1957 or later. You may also need a 2nd dose. For females of childbearing age, rubella immunity should be determined. If there is no evidence of immunity, females who are not  pregnant should be vaccinated. If there is no evidence of immunity, females who are pregnant should delay immunization until after pregnancy.  Pneumococcal 13-valent conjugate (PCV13) vaccine.** / Consult your health care provider.  Pneumococcal polysaccharide (PPSV23) vaccine.** / 1 to 2  doses if you smoke cigarettes or if you have certain conditions.  Meningococcal vaccine.** / 1 dose if you are age 88 to 6 years and a Market researcher living in a residence hall, or have one of several medical conditions, you need to get vaccinated against meningococcal disease. You may also need additional booster doses.  Hepatitis A vaccine.** / Consult your health care provider.  Hepatitis B vaccine.** / Consult your health care provider.  Haemophilus influenzae type b (Hib) vaccine.** / Consult your health care provider. Ages 23 to 64years  Blood pressure check.** / Every 1 to 2 years.  Lipid and cholesterol check.** / Every 5 years beginning at age 20 years.  Lung cancer screening. / Every year if you are aged 51 80 years and have a 30-pack-year history of smoking and currently smoke or have quit within the past 15 years. Yearly screening is stopped once you have quit smoking for at least 15 years or develop a health problem that would prevent you from having lung cancer treatment.  Clinical breast exam.** / Every year after age 8 years.  BRCA-related cancer risk assessment.** / For women who have family members with a BRCA-related cancer (breast, ovarian, tubal, or peritoneal cancers).  Mammogram.** / Every year beginning at age 10 years and continuing for as long as you are in good health. Consult with your health care provider.  Pap test.** / Every 3 years starting at age 30 years through age 5 or 61 years with a history of 3 consecutive normal Pap tests.  HPV screening.** / Every 3 years from ages 39 years through ages 72 to 19 years with a history of 3 consecutive normal Pap tests.  Fecal occult blood test (FOBT) of stool. / Every year beginning at age 59 years and continuing until age 27 years. You may not need to do this test if you get a colonoscopy every 10 years.  Flexible sigmoidoscopy or colonoscopy.** / Every 5 years for a flexible sigmoidoscopy or every  10 years for a colonoscopy beginning at age 110 years and continuing until age 63 years.  Hepatitis C blood test.** / For all people born from [birth year] through 1963 and any individual with known risks for hepatitis C.  Skin self-exam. / Monthly.  Influenza vaccine. / Every year.  Tetanus, diphtheria, and acellular pertussis (Tdap/Td) vaccine.** / Consult your health care provider. Pregnant women should receive 1 dose of Tdap vaccine during each pregnancy. 1 dose of Td every 10 years.  Varicella vaccine.** / Consult your health care provider. Pregnant females who do not have evidence of immunity should receive the first dose after pregnancy.  Zoster vaccine.** / 1 dose for adults aged 46 years or older.  Measles, mumps, rubella (MMR) vaccine.** / You need at least 1 dose of MMR if you were born in 1955 or later. You may also need a 2nd dose. For females of childbearing age, rubella immunity should be determined. If there is no evidence of immunity, females who are not pregnant should be vaccinated. If there is no evidence of immunity, females who are pregnant should delay immunization until after pregnancy.  Pneumococcal 13-valent conjugate (PCV13) vaccine.** / Consult your health care provider.  Pneumococcal polysaccharide (PPSV23) vaccine.** / 1  to 2 doses if you smoke cigarettes or if you have certain conditions.  Meningococcal vaccine.** / Consult your health care provider.  Hepatitis A vaccine.** / Consult your health care provider.  Hepatitis B vaccine.** / Consult your health care provider.  Haemophilus influenzae type b (Hib) vaccine.** / Consult your health care provider. Ages 62 years and over  Blood pressure check.** / Every 1 to 2 years.  Lipid and cholesterol check.** / Every 5 years beginning at age 76 years.  Lung cancer screening. / Every year if you are aged 66 80 years and have a 30-pack-year history of smoking and currently smoke or have quit within the past 15 years.  Yearly screening is stopped once you have quit smoking for at least 15 years or develop a health problem that would prevent you from having lung cancer treatment.  Clinical breast exam.** / Every year after age 30 years.  BRCA-related cancer risk assessment.** / For women who have family members with a BRCA-related cancer (breast, ovarian, tubal, or peritoneal cancers).  Mammogram.** / Every year beginning at age 37 years and continuing for as long as you are in good health. Consult with your health care provider.  Pap test.** / Every 3 years starting at age 84 years through age 69 or 61 years with 3 consecutive normal Pap tests. Testing can be stopped between 65 and 70 years with 3 consecutive normal Pap tests and no abnormal Pap or HPV tests in the past 10 years.  HPV screening.** / Every 3 years from ages 78 years through ages 38 or 52 years with a history of 3 consecutive normal Pap tests. Testing can be stopped between 65 and 70 years with 3 consecutive normal Pap tests and no abnormal Pap or HPV tests in the past 10 years.  Fecal occult blood test (FOBT) of stool. / Every year beginning at age 3 years and continuing until age 26 years. You may not need to do this test if you get a colonoscopy every 10 years.  Flexible sigmoidoscopy or colonoscopy.** / Every 5 years for a flexible sigmoidoscopy or every 10 years for a colonoscopy beginning at age 67 years and continuing until age 22 years.  Hepatitis C blood test.** / For all people born from [birth year] through 1963 and any individual with known risks for hepatitis C.  Osteoporosis screening.** / A one-time screening for women ages 50 years and over and women at risk for fractures or osteoporosis.  Skin self-exam. / Monthly.  Influenza vaccine. / Every year.  Tetanus, diphtheria, and acellular pertussis (Tdap/Td) vaccine.** / 1 dose of Td every 10 years.  Varicella vaccine.** / Consult your health care provider.  Zoster vaccine.** / 1  dose for adults aged 6 years or older.  Pneumococcal 13-valent conjugate (PCV13) vaccine.** / Consult your health care provider.  Pneumococcal polysaccharide (PPSV23) vaccine.** / 1 dose for all adults aged 74 years and older.  Meningococcal vaccine.** / Consult your health care provider.  Hepatitis A vaccine.** / Consult your health care provider.  Hepatitis B vaccine.** / Consult your health care provider.  Haemophilus influenzae type b (Hib) vaccine.** / Consult your health care provider. ** Family history and personal history of risk and conditions may change your health care provider's recommendations. Document Released: 02/07/2002 Document Revised: 10/02/2013 Document Reviewed: 05/09/2011 [facility] Patient Information 2014 [place], [place]. Seborrheic Dermatitis Seborrheic dermatitis involves pink or red skin with greasy, flaky scales. This is often found on the scalp, eyebrows, nose, bearded area, and  on or behind the ears. It can also occur on the central chest. It often occurs where there are more oil (sebaceous) glands. This condition is also known as dandruff. When this condition affects a baby's scalp, it is called cradle cap. It may come and go for no known reason. It can occur at any time of life from infancy to old age. CAUSES  The cause is unknown. It is not the result of too little moisture or too much oil. In some people, seborrheic dermatitis flare-ups seem to be triggered by stress. It also commonly occurs in people with certain diseases such as Parkinson's disease or HIV/AIDS. SYMPTOMS   Thick scales on the scalp.  Redness on the face or in the armpits.  The skin may seem oily or dry, but moisturizers do not help.  In infants, seborrheic dermatitis appears as scaly redness that does not seem to bother the baby. In some babies, it affects only the scalp. In others, it also affects the neck creases, armpits, groin, or behind the ears.  In adults and adolescents,  seborrheic dermatitis may affect only the scalp. It may look patchy or spread out, with areas of redness and flaking. Other areas commonly affected include:  Eyebrows.  Eyelids.  Forehead.  Skin behind the ears.  Outer ears.  Chest.  Armpits.  Nose creases.  Skin creases under the breasts.  Skin between the buttocks.  Groin.  Some adults and adolescents feel itching or burning in the affected areas. DIAGNOSIS  Your caregiver can usually tell what the problem is by doing a physical exam. TREATMENT   Cortisone (steroid) ointments, creams, and lotions can help decrease inflammation.  Babies can be treated with baby oil to soften the scales, then they may be washed with baby shampoo. If this does not help, a prescription topical steroid medicine may work.  Adults can use medicated shampoos.  Your caregiver may prescribe corticosteroid cream and shampoo containing an antifungal or yeast medicine (ketoconazole). Hydrocortisone or anti-yeast cream can be rubbed directly onto seborrheic dermatitis patches. Yeast does not cause seborrheic dermatitis, but it seems to add to the problem. In infants, seborrheic dermatitis is often worst during the first year of life. It tends to disappear on its own as the child grows. However, it may return during the teenage years. In adults and adolescents, seborrheic dermatitis tends to be a long-lasting condition that comes and goes over many years. HOME CARE INSTRUCTIONS   Use prescribed medicines as directed.  In infants, do not aggressively remove the scales or flakes on the scalp with a comb or by other means. This may lead to hair loss. SEEK MEDICAL CARE IF:   The problem does not improve from the medicated shampoos, lotions, or other medicines given by your caregiver.  You have any other questions or concerns. Document Released: 12/12/2005 Document Revised: 06/12/2012 Document Reviewed: 05/03/2010 [facility] Patient Information 2014  [place].

## 2014-06-02 NOTE — Assessment & Plan Note (Signed)
She is doing well on crestor I will check her FLP and TSH today

## 2014-06-02 NOTE — Assessment & Plan Note (Signed)
She has been treated with 3 different antibacterials and is getting worse so will treat with ketoconazole cream and desowen lotion

## 2014-06-02 NOTE — Addendum Note (Signed)
Addended by: Janith Lima on: 06/02/2014 03:07 PM   Modules accepted: Orders

## 2014-06-02 NOTE — Progress Notes (Signed)
Pre visit review using our clinic review tool, if applicable. No additional management support is needed unless otherwise documented below in the visit note. 

## 2014-06-02 NOTE — Assessment & Plan Note (Signed)
I will check her VIt D level and she is due for a DEXA scan

## 2014-06-02 NOTE — Progress Notes (Signed)
Subjective:    Patient ID: Kaylee Li, female    DOB: 1930-10-23, 80 y.o.   MRN: [id number]  Rash This is a recurrent problem. The current episode started 1 to 4 weeks ago. The problem has been gradually worsening since onset. The affected locations include the face. The rash is characterized by redness, scaling and itchiness. She was exposed to a new medication. Associated symptoms include facial edema. Pertinent negatives include no anorexia, congestion, cough, diarrhea, eye pain, fatigue, fever, joint pain, nail changes, rhinorrhea, shortness of breath, sore throat or vomiting. Past treatments include antibiotics. The treatment provided no relief.      Review of Systems  Constitutional: Negative.  Negative for fever, chills, diaphoresis, activity change, appetite change, fatigue and unexpected weight change.  HENT: Positive for facial swelling. Negative for congestion, rhinorrhea, sore throat and trouble swallowing.   Eyes: Negative.  Negative for pain and visual disturbance.  Respiratory: Negative.  Negative for cough, choking, chest tightness, shortness of breath, wheezing and stridor.   Cardiovascular: Negative.  Negative for chest pain, palpitations and leg swelling.  Gastrointestinal: Negative.  Negative for vomiting, abdominal pain, diarrhea, constipation, blood in stool and anorexia.  Endocrine: Negative.   Genitourinary: Negative.   Musculoskeletal: Negative.  Negative for arthralgias, back pain, joint pain, myalgias, neck pain and neck stiffness.  Skin: Positive for rash. Negative for nail changes, color change, pallor and wound.  Allergic/Immunologic: Negative.   Neurological: Negative.   Hematological: Negative.  Negative for adenopathy. Does not bruise/bleed easily.  Psychiatric/Behavioral: Negative.        Objective:   Physical Exam  Vitals reviewed. Constitutional: She is oriented to person, place, and time. She appears well-developed and well-nourished. No  distress.  HENT:  Head: Normocephalic and atraumatic.    Mouth/Throat: Oropharynx is clear and moist. No oropharyngeal exudate.  Eyes: Conjunctivae are normal. Right eye exhibits no discharge. Left eye exhibits no discharge. No scleral icterus.  Neck: Normal range of motion. Neck supple. No JVD present. No tracheal deviation present. No thyromegaly present.  Cardiovascular: Normal rate, regular rhythm, normal heart sounds and intact distal pulses.  Exam reveals no gallop and no friction rub.   No murmur heard. Pulmonary/Chest: Effort normal and breath sounds normal. No stridor. No respiratory distress. She has no wheezes. She has no rales. She exhibits no tenderness.  Abdominal: Soft. Bowel sounds are normal. She exhibits no distension and no mass. There is no tenderness. There is no rebound and no guarding.  Musculoskeletal: Normal range of motion. She exhibits no edema and no tenderness.  Lymphadenopathy:    She has no cervical adenopathy.  Neurological: She is oriented to person, place, and time.  Skin: Skin is warm, dry and intact. Rash noted. No abrasion, no bruising, no burn, no ecchymosis, no laceration, no lesion, no petechiae and no purpura noted. Rash is not macular, not papular, not maculopapular, not nodular, not pustular, not vesicular and not urticarial. She is not diaphoretic. No cyanosis or erythema. No pallor. Nails show no clubbing.  Psychiatric: She has a normal mood and affect. Her behavior is normal. Judgment and thought content normal.     Lab Results  Component Value Date   WBC 10.1 08/13/2012   HGB 11.7* 08/13/2012   HCT 35.6* 08/13/2012   PLT 359.0 08/13/2012   GLUCOSE 115* 08/13/2012   CHOL 197 08/13/2012   TRIG 75.0 08/13/2012   HDL 105.20 08/13/2012   LDLDIRECT 146.8 06/01/2011   LDLCALC 77 08/13/2012   ALT 13  08/13/2012   AST 15 08/13/2012   NA 138 08/13/2012   K 4.0 08/13/2012   CL 103 08/13/2012   CREATININE 0.9 08/13/2012   BUN 30* 08/13/2012   CO2 26 08/13/2012     TSH 1.79 08/13/2012       Assessment & Plan:

## 2014-06-02 NOTE — Assessment & Plan Note (Signed)
I will check her A1C to see if she has developed DM2 

## 2014-06-02 NOTE — Assessment & Plan Note (Signed)
She is having an allergic reaction to the bactroban ointment I gave her an injection of depo-medrol and have asked her stop the bactroban and start desowen lotion

## 2014-09-17 ENCOUNTER — Telehealth: Payer: Self-pay

## 2014-09-17 DIAGNOSIS — E785 Hyperlipidemia, unspecified: Secondary | ICD-10-CM

## 2014-09-17 DIAGNOSIS — I10 Essential (primary) hypertension: Secondary | ICD-10-CM

## 2014-09-17 MED ORDER — LOSARTAN POTASSIUM 50 MG PO TABS: 50.0000 mg | ORAL_TABLET | Freq: Every day | ORAL | Status: DC

## 2014-09-17 MED ORDER — PRAVASTATIN SODIUM 40 MG PO TABS: 40.0000 mg | ORAL_TABLET | Freq: Every day | ORAL | Status: DC

## 2014-09-17 NOTE — Telephone Encounter (Signed)
Pt states that benicar 40 mg 1 tab po daily and crestor 5 mg 1 tab po daily have high co-pays and that she would like substitute.  Benicar can be switched to losartan and crestor can be switched to zocor or pravachol.  Please advise

## 2014-10-02 ENCOUNTER — Emergency Department (HOSPITAL_COMMUNITY)
Admission: EM | Admit: 2014-10-02 | Discharge: 2014-10-02 | Disposition: A | Discharge status: Home / Self Care | Payer: Medicare Other | Attending: Emergency Medicine | Admitting: Emergency Medicine

## 2014-10-02 ENCOUNTER — Encounter (HOSPITAL_COMMUNITY): Payer: Self-pay | Admitting: Emergency Medicine

## 2014-10-02 DIAGNOSIS — J449 Chronic obstructive pulmonary disease, unspecified: Secondary | ICD-10-CM | POA: Insufficient documentation

## 2014-10-02 DIAGNOSIS — Z7982 Long term (current) use of aspirin: Secondary | ICD-10-CM | POA: Insufficient documentation

## 2014-10-02 DIAGNOSIS — R040 Epistaxis: Secondary | ICD-10-CM | POA: Diagnosis not present

## 2014-10-02 DIAGNOSIS — Z862 Personal history of diseases of the blood and blood-forming organs and certain disorders involving the immune mechanism: Secondary | ICD-10-CM | POA: Insufficient documentation

## 2014-10-02 DIAGNOSIS — Z79899 Other long term (current) drug therapy: Secondary | ICD-10-CM | POA: Insufficient documentation

## 2014-10-02 DIAGNOSIS — M1990 Unspecified osteoarthritis, unspecified site: Secondary | ICD-10-CM | POA: Diagnosis not present

## 2014-10-02 DIAGNOSIS — I10 Essential (primary) hypertension: Secondary | ICD-10-CM | POA: Diagnosis not present

## 2014-10-02 DIAGNOSIS — E785 Hyperlipidemia, unspecified: Secondary | ICD-10-CM | POA: Insufficient documentation

## 2014-10-02 DIAGNOSIS — Z720 Tobacco use: Secondary | ICD-10-CM | POA: Diagnosis not present

## 2014-10-02 LAB — CBC WITH DIFFERENTIAL/PLATELET
BASOS ABS: 0.1 10*3/uL (ref 0.0–0.1)
BASOS PCT: 1 % (ref 0–1)
Eosinophils Absolute: 0.1 10*3/uL (ref 0.0–0.7)
Eosinophils Relative: 1 % (ref 0–5)
HEMATOCRIT: 40.1 % (ref 36.0–46.0)
HEMOGLOBIN: 12.8 g/dL (ref 12.0–15.0)
Lymphocytes Relative: 13 % (ref 12–46)
Lymphs Abs: 1.2 10*3/uL (ref 0.7–4.0)
MCH: 30.5 pg (ref 26.0–34.0)
MCHC: 31.9 g/dL (ref 30.0–36.0)
MCV: 95.7 fL (ref 78.0–100.0)
MONO ABS: 1.1 10*3/uL — AB (ref 0.1–1.0)
MONOS PCT: 11 % (ref 3–12)
Neutro Abs: 7.4 10*3/uL (ref 1.7–7.7)
Neutrophils Relative %: 74 % (ref 43–77)
Platelets: 319 10*3/uL (ref 150–400)
RBC: 4.19 MIL/uL (ref 3.87–5.11)
RDW: 12.7 % (ref 11.5–15.5)
WBC: 9.9 10*3/uL (ref 4.0–10.5)

## 2014-10-02 MED ORDER — COCAINE HCL 4 % EX SOLN
4.0000 mL | Freq: Once | CUTANEOUS | Status: AC
  Administered 2014-10-02: 4 mL via NASAL
  Filled 2014-10-02: qty 4

## 2014-10-02 NOTE — ED Provider Notes (Signed)
CSN: [id number]     Arrival date & time 10/02/14  1711 History   First MD Initiated Contact with Patient 10/02/14 1725     Chief Complaint  Patient presents with  . Epistaxis     (Consider location/radiation/quality/duration/timing/severity/associated sxs/prior Treatment) Patient is a 80 y.o. female presenting with nosebleeds. The history is provided by the patient. No language interpreter was used.  Epistaxis Location:  L nare Severity:  Moderate Duration:  8 hours Timing:  Constant Progression:  Unchanged Chronicity:  Recurrent Context: aspirin use and hypertension   Context: not anticoagulants and not bleeding disorder   Ineffective treatments:  Applying pressure Associated symptoms: no blood in oropharynx and no dizziness   Risk factors: frequent nosebleeds     Past Medical History  Diagnosis Date  . Hypertension   . Hyperlipidemia   . COPD (chronic obstructive pulmonary disease)   . Anemia   . Arthritis   . Emphysema of lung    Past Surgical History  Procedure Laterality Date  . Abdominal hysterectomy     Family History  Problem Relation Age of Onset  . Cancer Neg Hx   . Hypertension Neg Hx   . Heart disease Neg Hx    History  Substance Use Topics  . Smoking status: Current Every Day Smoker -- 1.00 packs/day for 60 years    Types: Cigarettes  . Smokeless tobacco: Never Used  . Alcohol Use: 1.8 oz/week    3 Glasses of wine per week     Comment: every night   OB History   Grav Para Term Preterm Abortions TAB SAB Ect Mult Living                 Review of Systems  HENT: Positive for nosebleeds.   Respiratory: Negative for shortness of breath.   Cardiovascular: Negative for chest pain.  Gastrointestinal: Negative for nausea and vomiting.  Neurological: Negative for dizziness.  All other systems reviewed and are negative.     Allergies  Review of patient's allergies indicates no known allergies.  Home Medications   Prior to Admission  medications   Medication Sig Start Date End Date Taking? Authorizing Provider  acetaminophen (TYLENOL) 500 MG tablet Take 1,000 mg by mouth 3 (three) times daily as needed for mild pain.     Historical Provider, MD  aspirin 81 MG tablet Take 81 mg by mouth daily.      Historical Provider, MD  Cholecalciferol 50000 UNITS TABS Take 1 tablet by mouth once a week. 06/02/14   Janith Lima, MD  desonide (DESOWEN) 0.05 % lotion Apply topically 2 (two) times daily. 06/02/14   Janith Lima, MD  ketoconazole (NIZORAL) 2 % cream Apply 1 application topically 2 (two) times daily. 06/02/14   Janith Lima, MD  loratadine (CLARITIN) 10 MG tablet Take 1 tablet (10 mg total) by mouth daily. 05/02/14   Rowe Clack, MD  losartan (COZAAR) 50 MG tablet Take 1 tablet (50 mg total) by mouth daily. 09/17/14   Janith Lima, MD  pravastatin (PRAVACHOL) 40 MG tablet Take 1 tablet (40 mg total) by mouth daily. 09/17/14   Janith Lima, MD   BP 124/74  Pulse 115  Temp(Src) 97.6 F (36.4 C) (Oral)  Resp 18  SpO2 94% Physical Exam  Nursing note and vitals reviewed. Constitutional: She is oriented to person, place, and time. She appears well-developed and well-nourished. No distress.  HENT:  Head: Normocephalic and atraumatic.  Nose: Epistaxis is observed.  Mouth/Throat: Oropharynx is clear and moist.  Small area of brisk bleeding noted medial aspect of distal nare.  Eyes: Pupils are equal, round, and reactive to light.  Neck: Neck supple.  Cardiovascular: Normal rate and regular rhythm.   Pulmonary/Chest: Effort normal and breath sounds normal.  Abdominal: Soft. Bowel sounds are normal.  Musculoskeletal: She exhibits no edema and no tenderness.  Lymphadenopathy:    She has no cervical adenopathy.  Neurological: She is alert and oriented to person, place, and time.  Skin: Skin is warm and dry.  Psychiatric: She has a normal mood and affect.    ED Course  Procedures (including critical care time) Labs  Review Labs Reviewed  CBC WITH DIFFERENTIAL    Imaging Review No results found.   EKG Interpretation None     Patient discussed with and seen by Dr. Tomi Bamberger. Lab results reviewed and shared with patient. Epistaxis controlled with topical cocaine. MDM   Final diagnoses:  None   Epistaxis of left nare.  Return precautions discussed with family and patient.  Follow-up with PCP.    Norman Herrlich, NP 10/03/14 [phone]

## 2014-10-02 NOTE — Discharge Instructions (Signed)
Nosebleed A nosebleed can be caused by many things, including:  Getting hit hard in the nose.  Infections.  Dry nose.  Colds.  Medicines. Your doctor may do lab testing if you get nosebleeds a lot and the cause is not known. HOME CARE   IDo not blow your nose for 12 hours after the nosebleed.  Sit up and bend forward if your nose starts bleeding again. Pinch the front half of your nose nonstop for 20 minutes.  Put petroleum jelly inside your nose every morning if you have a dry nose.  Use a humidifier to make the air less dry.  Do not take aspirin.  Try not to strain, lift, or bend at the waist for many days after the nosebleed. GET HELP RIGHT AWAY IF:   Nosebleeds keep happening and are hard to stop or control.  You have bleeding or bruises that are not normal on other parts of the body.  You have a fever.  The nosebleeds get worse.  You get lightheaded, feel faint, sweaty, or throw up (vomit) blood. MAKE SURE YOU:   Understand these instructions.  Will watch your condition.  Will get help right away if you are not doing well or get worse. Document Released: 09/20/2008 Document Revised: 03/05/2012 Document Reviewed: 09/20/2008 [facility] Patient Information 2015 [place], [place]. This information is not intended to replace advice given to you by your health care provider. Make sure you discuss any questions you have with your health care provider.

## 2014-10-02 NOTE — ED Notes (Signed)
EDP at bedside  

## 2014-10-02 NOTE — ED Notes (Signed)
Pt with chronic recurrent mild epistaxis reports to ED for left sided epistaxis more severe than usual, states she has soaked through 12 tissue nose plugs. Denies dizziness, denies blood leaking into throat, skin warm and dry, moderate amount of bright red blood on tissue plug in patient's left nostril.

## 2014-10-02 NOTE — ED Notes (Signed)
Pt verbalizes understanding of discharge instructions and directions for follow-up care. Pt voices no additional questions or concerns.

## 2014-10-06 NOTE — ED Provider Notes (Signed)
Medical screening examination/treatment/procedure(s) were conducted as a shared visit with non-physician practitioner(s) and myself.  I personally evaluated the patient during the encounter.  Procedure by NP Tamala Julian: Left sided epistaxis.  Small visible vessel bleeding left medial distal nares.  Pressure and cocaine solution applied using a cotton swab ball.  Pt tolerated well.  Bleeding resolved.    Dorie Rank, MD 10/06/14 [phone]

## 2014-11-24 ENCOUNTER — Encounter: Payer: Self-pay | Admitting: Internal Medicine

## 2014-11-24 ENCOUNTER — Ambulatory Visit (INDEPENDENT_AMBULATORY_CARE_PROVIDER_SITE_OTHER): Payer: Medicare Other | Admitting: Internal Medicine

## 2014-11-24 VITALS — BP 120/74 | HR 92 | Temp 98.1°F | Ht 66.0 in | Wt 100.0 lb

## 2014-11-24 DIAGNOSIS — L219 Seborrheic dermatitis, unspecified: Secondary | ICD-10-CM

## 2014-11-24 DIAGNOSIS — J3489 Other specified disorders of nose and nasal sinuses: Secondary | ICD-10-CM

## 2014-11-24 DIAGNOSIS — M159 Polyosteoarthritis, unspecified: Secondary | ICD-10-CM

## 2014-11-24 DIAGNOSIS — M150 Primary generalized (osteo)arthritis: Secondary | ICD-10-CM

## 2014-11-24 MED ORDER — KETOCONAZOLE 2 % EX CREA: 1.0000 "application " | TOPICAL_CREAM | Freq: Two times a day (BID) | CUTANEOUS | Status: DC

## 2014-11-24 MED ORDER — TRAMADOL HCL 50 MG PO TABS: 50.0000 mg | ORAL_TABLET | Freq: Four times a day (QID) | ORAL | Status: DC | PRN

## 2014-11-24 MED ORDER — DESONIDE 0.05 % EX LOTN: TOPICAL_LOTION | Freq: Two times a day (BID) | CUTANEOUS | Status: DC

## 2014-11-24 NOTE — Patient Instructions (Signed)
Seborrheic Dermatitis °Seborrheic dermatitis involves pink or red skin with greasy, flaky scales. This is often found on the scalp, eyebrows, nose, bearded area, and on or behind the ears. It can also occur on the central chest. It often occurs where there are more oil (sebaceous) glands. This condition is also known as dandruff. When this condition affects a baby's scalp, it is called cradle cap. It may come and go for no known reason. It can occur at any time of life from infancy to old age. °CAUSES  °The cause is unknown. It is not the result of too little moisture or too much oil. In some people, seborrheic dermatitis flare-ups seem to be triggered by stress. It also commonly occurs in people with certain diseases such as Parkinson's disease or HIV/AIDS. °SYMPTOMS  °· Thick scales on the scalp. °· Redness on the face or in the armpits. °· The skin may seem oily or dry, but moisturizers do not help. °· In infants, seborrheic dermatitis appears as scaly redness that does not seem to bother the baby. In some babies, it affects only the scalp. In others, it also affects the neck creases, armpits, groin, or behind the ears. °· In adults and adolescents, seborrheic dermatitis may affect only the scalp. It may look patchy or spread out, with areas of redness and flaking. Other areas commonly affected include: °¨ Eyebrows. °¨ Eyelids. °¨ Forehead. °¨ Skin behind the ears. °¨ Outer ears. °¨ Chest. °¨ Armpits. °¨ Nose creases. °¨ Skin creases under the breasts. °¨ Skin between the buttocks. °¨ Groin. °· Some adults and adolescents feel itching or burning in the affected areas. °DIAGNOSIS  °Your caregiver can usually tell what the problem is by doing a physical exam. °TREATMENT  °· Cortisone (steroid) ointments, creams, and lotions can help decrease inflammation. °· Babies can be treated with baby oil to soften the scales, then they may be washed with baby shampoo. If this does not help, a prescription topical steroid  medicine may work. °· Adults can use medicated shampoos. °· Your caregiver may prescribe corticosteroid cream and shampoo containing an antifungal or yeast medicine (ketoconazole). Hydrocortisone or anti-yeast cream can be rubbed directly onto seborrheic dermatitis patches. Yeast does not cause seborrheic dermatitis, but it seems to add to the problem. °In infants, seborrheic dermatitis is often worst during the first year of life. It tends to disappear on its own as the child grows. However, it may return during the teenage years. In adults and adolescents, seborrheic dermatitis tends to be a long-lasting condition that comes and goes over many years. °HOME CARE INSTRUCTIONS  °· Use prescribed medicines as directed. °· In infants, do not aggressively remove the scales or flakes on the scalp with a comb or by other means. This may lead to hair loss. °SEEK MEDICAL CARE IF:  °· The problem does not improve from the medicated shampoos, lotions, or other medicines given by your caregiver. °· You have any other questions or concerns. °Document Released: 12/12/2005 Document Revised: 06/12/2012 Document Reviewed: 05/03/2010 °[employer]® Patient Information ©2015 [employer]. This information is not intended to replace advice given to you by your health care provider. Make sure you discuss any questions you have with your health care provider. ° °

## 2014-11-24 NOTE — Progress Notes (Signed)
Pre visit review using our clinic review tool, if applicable. No additional management support is needed unless otherwise documented below in the visit note. 

## 2014-11-25 NOTE — Progress Notes (Signed)
Subjective:    Patient ID: Kaylee Li, female    DOB: 1930-07-26, 80 y.o.   MRN: [id number]  Rash This is a recurrent problem. The current episode started more than 1 year ago. The problem is unchanged. The affected locations include the face. The rash is characterized by redness, scaling and itchiness. She was exposed to nothing. Pertinent negatives include no anorexia, congestion, cough, diarrhea, eye pain, facial edema, fatigue, fever, joint pain, nail changes, rhinorrhea, shortness of breath, sore throat or vomiting. Past treatments include nothing. The treatment provided no relief.      Review of Systems  Constitutional: Negative.  Negative for fever, chills, diaphoresis, appetite change and fatigue.  HENT: Positive for nosebleeds (left side). Negative for congestion, mouth sores, postnasal drip, rhinorrhea, sinus pressure, sore throat and trouble swallowing.   Eyes: Negative.  Negative for pain.  Respiratory: Negative.  Negative for cough, choking, chest tightness, shortness of breath and stridor.   Cardiovascular: Negative.  Negative for chest pain, palpitations and leg swelling.  Gastrointestinal: Negative.  Negative for vomiting, abdominal pain, diarrhea and anorexia.  Endocrine: Negative.   Genitourinary: Negative.   Musculoskeletal: Positive for arthralgias. Negative for myalgias, back pain, joint pain and joint swelling.  Skin: Positive for rash. Negative for nail changes.  Allergic/Immunologic: Negative.   Neurological: Negative.   Hematological: Negative.  Negative for adenopathy. Does not bruise/bleed easily.  Psychiatric/Behavioral: Negative.        Objective:   Physical Exam  Constitutional: She is oriented to person, place, and time. She appears well-developed and well-nourished. No distress.  HENT:  Head: Normocephalic and atraumatic.    Nose: Nasal deformity present. No mucosal edema, rhinorrhea, nose lacerations, sinus tenderness, septal deviation or nasal  septal hematoma. No epistaxis.  No foreign bodies.    Mouth/Throat: Oropharynx is clear and moist. No oropharyngeal exudate.  Eyes: Conjunctivae are normal. Right eye exhibits no discharge. Left eye exhibits no discharge. No scleral icterus.  Neck: Normal range of motion. Neck supple. No JVD present. No tracheal deviation present. No thyromegaly present.  Cardiovascular: Normal rate, regular rhythm, normal heart sounds and intact distal pulses.  Exam reveals no gallop and no friction rub.   No murmur heard. Pulmonary/Chest: Effort normal and breath sounds normal. No stridor. No respiratory distress. She has no wheezes. She has no rales. She exhibits no tenderness.  Abdominal: Soft. Bowel sounds are normal. She exhibits no distension and no mass. There is no tenderness. There is no rebound and no guarding.  Musculoskeletal: Normal range of motion. She exhibits no edema or tenderness.  Lymphadenopathy:    She has no cervical adenopathy.  Neurological: She is oriented to person, place, and time.  Skin: Skin is warm. Rash noted. She is not diaphoretic. No erythema. No pallor.  Psychiatric: She has a normal mood and affect. Her behavior is normal. Thought content normal.  Vitals reviewed.    Lab Results  Component Value Date   WBC 9.9 10/02/2014   HGB 12.8 10/02/2014   HCT 40.1 10/02/2014   PLT 319 10/02/2014   GLUCOSE 117* 06/02/2014   CHOL 229* 06/02/2014   TRIG 144.0 06/02/2014   HDL 98.00 06/02/2014   LDLDIRECT 146.8 06/01/2011   LDLCALC 102* 06/02/2014   ALT 16 06/02/2014   AST 16 06/02/2014   NA 138 06/02/2014   K 4.5 06/02/2014   CL 105 06/02/2014   CREATININE 0.8 06/02/2014   BUN 19 06/02/2014   CO2 25 06/02/2014   TSH 1.40 06/02/2014  HGBA1C 5.6 06/02/2014       Assessment & Plan:

## 2014-11-25 NOTE — Assessment & Plan Note (Signed)
She will try tramadol for the pain 

## 2014-11-25 NOTE — Assessment & Plan Note (Signed)
I am concerned that this may be cancer --> ENT referral to consider a biopsy

## 2014-11-25 NOTE — Assessment & Plan Note (Signed)
Will treat with nizoral and desowen

## 2015-01-13 ENCOUNTER — Encounter (HOSPITAL_COMMUNITY): Payer: Self-pay | Admitting: *Deleted

## 2015-01-13 ENCOUNTER — Emergency Department (HOSPITAL_COMMUNITY)
Admission: EM | Admit: 2015-01-13 | Discharge: 2015-01-13 | Disposition: A | Discharge status: Home / Self Care | Payer: Commercial Managed Care - HMO | Attending: Emergency Medicine | Admitting: Emergency Medicine

## 2015-01-13 DIAGNOSIS — Y9389 Activity, other specified: Secondary | ICD-10-CM | POA: Diagnosis not present

## 2015-01-13 DIAGNOSIS — Y998 Other external cause status: Secondary | ICD-10-CM | POA: Insufficient documentation

## 2015-01-13 DIAGNOSIS — Y9289 Other specified places as the place of occurrence of the external cause: Secondary | ICD-10-CM | POA: Insufficient documentation

## 2015-01-13 DIAGNOSIS — Z862 Personal history of diseases of the blood and blood-forming organs and certain disorders involving the immune mechanism: Secondary | ICD-10-CM | POA: Diagnosis not present

## 2015-01-13 DIAGNOSIS — Z720 Tobacco use: Secondary | ICD-10-CM | POA: Diagnosis not present

## 2015-01-13 DIAGNOSIS — I10 Essential (primary) hypertension: Secondary | ICD-10-CM | POA: Insufficient documentation

## 2015-01-13 DIAGNOSIS — Z7982 Long term (current) use of aspirin: Secondary | ICD-10-CM | POA: Diagnosis not present

## 2015-01-13 DIAGNOSIS — E785 Hyperlipidemia, unspecified: Secondary | ICD-10-CM | POA: Insufficient documentation

## 2015-01-13 DIAGNOSIS — Z79899 Other long term (current) drug therapy: Secondary | ICD-10-CM | POA: Insufficient documentation

## 2015-01-13 DIAGNOSIS — S81811A Laceration without foreign body, right lower leg, initial encounter: Secondary | ICD-10-CM | POA: Diagnosis not present

## 2015-01-13 DIAGNOSIS — M1990 Unspecified osteoarthritis, unspecified site: Secondary | ICD-10-CM | POA: Diagnosis not present

## 2015-01-13 DIAGNOSIS — J449 Chronic obstructive pulmonary disease, unspecified: Secondary | ICD-10-CM | POA: Insufficient documentation

## 2015-01-13 DIAGNOSIS — W458XXA Other foreign body or object entering through skin, initial encounter: Secondary | ICD-10-CM | POA: Diagnosis not present

## 2015-01-13 DIAGNOSIS — Z23 Encounter for immunization: Secondary | ICD-10-CM | POA: Insufficient documentation

## 2015-01-13 MED ORDER — TETANUS-DIPHTH-ACELL PERTUSSIS 5-2.5-18.5 LF-MCG/0.5 IM SUSP
0.5000 mL | Freq: Once | INTRAMUSCULAR | Status: AC
  Administered 2015-01-13: 0.5 mL via INTRAMUSCULAR
  Filled 2015-01-13: qty 0.5

## 2015-01-13 MED ORDER — LIDOCAINE-EPINEPHRINE 2 %-1:100000 IJ SOLN
INTRAMUSCULAR | Status: AC
  Administered 2015-01-13: 21:00:00
  Filled 2015-01-13: qty 1

## 2015-01-13 NOTE — Discharge Instructions (Signed)
°  Sterile Tape Wound Care Some cuts and wounds can be closed using sterile tape, also called skin adhesive strips. Skin adhesive strips can be used for shallow (superficial) and simple cuts, wounds, lacerations, and surgical incisions. These strips act in place of stitches to hold the edges of the wound together, allowing for faster healing. Unlike stitches, the adhesive strips do not require needles or anesthetic medicine for placement. The strips will wear off naturally as the wound is healing. It is important to take proper care of your wound at home while it heals.  HOME CARE INSTRUCTIONS  Try to keep the area around your wound clean and dry. Do not allow the adhesive strips to get wet for the first 12 hours.   Do not use any soaps or ointments on the wound for the first 12 hours.   If a bandage (dressing) has been applied, follow your health care provider's instructions for how often to change the dressing. Keep the dressing dry if one has been applied.   Do not remove the adhesive strips. They will fall off on their own. If they do not, you may remove them gently after 10 days. You should gently wet the strips before removing them. For example, this can be done in the shower.  Do not scratch, pick, or rub the wound area.   Protect the wound from further injury until it is healed.   Protect the wound from sun and tanning bed exposure while it is healing and for several weeks after healing.   Only take over-the-counter or prescription medicines as directed by your health care provider.   Keep all follow-up appointments as directed by your health care provider.  SEEK MEDICAL CARE IF: Your adhesive strips become wet or soaked with blood before the wound has healed. The tape will need to be replaced.  SEEK IMMEDIATE MEDICAL CARE IF:  You have increasing pain in the wound.   You develop a rash after the strips are applied.  Your wound becomes red, swollen, hot, or tender.    You have a red streak that goes away from the wound.   You have pus coming from the wound.   You have increased bleeding from the wound.  You notice a bad smell coming from the wound.   Your wound breaks open. MAKE SURE YOU:  Understand these instructions.  Will watch your condition.  Will get help right away if you are not doing well or get worse. Document Released: 01/19/2005 Document Revised: 10/02/2013 Document Reviewed: 07/03/2013 [facility] Patient Information 2015 [place], [place]. This information is not intended to replace advice given to you by your health care provider. Make sure you discuss any questions you have with your health care provider.

## 2015-01-13 NOTE — ED Notes (Signed)
Pt reports running into her bed frame yesterday.  Large skin avulsion noted on RLE, active bleeding noted.  Dressing applied.

## 2015-01-14 NOTE — ED Provider Notes (Signed)
CSN: [id number]     Arrival date & time 01/13/15  2020 History   First MD Initiated Contact with Patient 01/13/15 2052     Chief Complaint  Patient presents with  . Extremity Laceration     (Consider location/radiation/quality/duration/timing/severity/associated sxs/prior Treatment) The history is provided by the patient.   patient cut her right lower leg on a head a few days ago. She has had some bleeding. No other injuries. No lightheadedness or dizziness.  Past Medical History  Diagnosis Date  . Hypertension   . Hyperlipidemia   . COPD (chronic obstructive pulmonary disease)   . Anemia   . Arthritis   . Emphysema of lung    Past Surgical History  Procedure Laterality Date  . Abdominal hysterectomy     Family History  Problem Relation Age of Onset  . Cancer Neg Hx   . Hypertension Neg Hx   . Heart disease Neg Hx    History  Substance Use Topics  . Smoking status: Current Every Day Smoker -- 1.00 packs/day for 60 years    Types: Cigarettes  . Smokeless tobacco: Never Used  . Alcohol Use: 1.8 oz/week    3 Glasses of wine per week     Comment: every night   OB History    No data available     Review of Systems  Constitutional: Negative for fever and chills.  Respiratory: Negative for shortness of breath.   Cardiovascular: Negative for chest pain.  Musculoskeletal: Negative for back pain.  Skin: Positive for wound.      Allergies  Review of patient's allergies indicates no known allergies.  Home Medications   Prior to Admission medications   Medication Sig Start Date End Date Taking? Authorizing Provider  acetaminophen (TYLENOL) 500 MG tablet Take 1,000 mg by mouth 3 (three) times daily.    Yes Historical Provider, MD  aspirin 81 MG tablet Take 81 mg by mouth daily.     Yes Historical Provider, MD  desonide (DESOWEN) 0.05 % lotion Apply topically 2 (two) times daily. 11/24/14  Yes Janith Lima, MD  losartan (COZAAR) 50 MG tablet Take 1 tablet (50 mg  total) by mouth daily. 09/17/14  Yes Janith Lima, MD  pravastatin (PRAVACHOL) 40 MG tablet Take 1 tablet (40 mg total) by mouth daily. 09/17/14  Yes Janith Lima, MD  ketoconazole (NIZORAL) 2 % cream Apply 1 application topically 2 (two) times daily. Patient not taking: Reported on 01/13/2015 11/24/14   Janith Lima, MD  traMADol (ULTRAM) 50 MG tablet Take 1 tablet (50 mg total) by mouth every 6 (six) hours as needed. Patient not taking: Reported on 01/13/2015 11/24/14   Janith Lima, MD   BP 192/93 mmHg  Pulse 108  Temp(Src) 98.1 F (36.7 C) (Oral)  Resp 18  SpO2 96% Physical Exam  Constitutional: She appears well-developed.  Cardiovascular: Normal rate.   Pulmonary/Chest: Effort normal.  Neurological: She is alert.  Skin: Skin is warm.  7 cm laceration to right lower leg. It is horizontal. Some gaping of the wound.    ED Course  Procedures (including critical care time) Labs Review Labs Reviewed - No data to display  Imaging Review No results found.   EKG Interpretation None      MDM   Final diagnoses:  Laceration of lower leg, right, initial encounter    *Patient with laceration. Wound closed with Steri-Strips. No clear infection. Tetanus updated.      Jasper Riling. Alvino Chapel, MD  01/14/15 0009 

## 2015-01-22 ENCOUNTER — Encounter: Payer: Self-pay | Admitting: Internal Medicine

## 2015-01-22 ENCOUNTER — Ambulatory Visit (INDEPENDENT_AMBULATORY_CARE_PROVIDER_SITE_OTHER): Payer: Commercial Managed Care - HMO | Admitting: Internal Medicine

## 2015-01-22 VITALS — BP 138/88 | HR 116 | Temp 97.9°F | Ht 66.0 in | Wt 95.0 lb

## 2015-01-22 DIAGNOSIS — T798XXA Other early complications of trauma, initial encounter: Secondary | ICD-10-CM

## 2015-01-22 DIAGNOSIS — L089 Local infection of the skin and subcutaneous tissue, unspecified: Secondary | ICD-10-CM | POA: Insufficient documentation

## 2015-01-22 DIAGNOSIS — T148XXA Other injury of unspecified body region, initial encounter: Secondary | ICD-10-CM

## 2015-01-22 MED ORDER — CEFUROXIME AXETIL 500 MG PO TABS: 500.0000 mg | ORAL_TABLET | Freq: Two times a day (BID) | ORAL | Status: AC

## 2015-01-22 NOTE — Progress Notes (Signed)
   Subjective:    Patient ID: Kaylee Li, female    DOB: 11-28-1930, 81 y.o.   MRN: [id number]  Wound Check She was originally treated 5 to 10 days ago. Previous treatment included wound cleansing or irrigation and laceration repair. Her temperature was unmeasured prior to arrival. There has been bloody discharge from the wound. The redness has worsened. There is new swelling present. The pain has improved. She has no difficulty moving the affected extremity or digit.      Review of Systems  Constitutional: Negative.  Negative for fever, chills, diaphoresis, appetite change and fatigue.  HENT: Negative.   Eyes: Negative.   Respiratory: Negative.  Negative for cough, choking, chest tightness, shortness of breath and stridor.   Cardiovascular: Negative.  Negative for chest pain, palpitations and leg swelling.  Gastrointestinal: Negative.  Negative for nausea, vomiting, abdominal pain, diarrhea, constipation and blood in stool.  Endocrine: Negative.   Genitourinary: Negative.   Musculoskeletal: Negative.   Skin: Positive for color change and wound. Negative for pallor and rash.  Allergic/Immunologic: Negative.   Neurological: Negative.   Hematological: Negative.  Negative for adenopathy. Does not bruise/bleed easily.  Psychiatric/Behavioral: Negative.        Objective:   Physical Exam  Constitutional: She is oriented to person, place, and time. She appears well-developed and well-nourished.  Non-toxic appearance. She does not have a sickly appearance. She does not appear ill. No distress.  HENT:  Head: Normocephalic and atraumatic.  Mouth/Throat: Oropharynx is clear and moist. No oropharyngeal exudate.  Eyes: Conjunctivae are normal. Right eye exhibits no discharge. Left eye exhibits no discharge. No scleral icterus.  Neck: Normal range of motion. Neck supple. No JVD present. No tracheal deviation present. No thyromegaly present.  Cardiovascular: Normal rate, regular rhythm, normal  heart sounds and intact distal pulses.  Exam reveals no gallop and no friction rub.   No murmur heard. Pulmonary/Chest: Effort normal and breath sounds normal. No stridor. No respiratory distress. She has no wheezes. She has no rales. She exhibits no tenderness.  Abdominal: Soft. Bowel sounds are normal. She exhibits no distension and no mass. There is no tenderness. There is no rebound and no guarding.  Musculoskeletal: Normal range of motion. She exhibits no edema or tenderness.       Legs: Lymphadenopathy:    She has no cervical adenopathy.  Neurological: She is oriented to person, place, and time.  Skin: Skin is warm and dry. No rash noted. She is not diaphoretic. No erythema. No pallor.  Vitals reviewed.   Lab Results  Component Value Date   WBC 9.9 10/02/2014   HGB 12.8 10/02/2014   HCT 40.1 10/02/2014   PLT 319 10/02/2014   GLUCOSE 117* 06/02/2014   CHOL 229* 06/02/2014   TRIG 144.0 06/02/2014   HDL 98.00 06/02/2014   LDLDIRECT 146.8 06/01/2011   LDLCALC 102* 06/02/2014   ALT 16 06/02/2014   AST 16 06/02/2014   NA 138 06/02/2014   K 4.5 06/02/2014   CL 105 06/02/2014   CREATININE 0.8 06/02/2014   BUN 19 06/02/2014   CO2 25 06/02/2014   TSH 1.40 06/02/2014   HGBA1C 5.6 06/02/2014        Assessment & Plan:

## 2015-01-22 NOTE — Assessment & Plan Note (Signed)
The wound has not adequately closed so the steri-strips will stay in place A clx has been sent Will start ceftin for what appears to be a strep infection She agrees to do a better job of wound care and elevation of the LE

## 2015-01-22 NOTE — Progress Notes (Signed)
Pre visit review using our clinic review tool, if applicable. No additional management support is needed unless otherwise documented below in the visit note. 

## 2015-01-22 NOTE — Patient Instructions (Signed)
Wound Infection °A wound infection happens when a type of germ (bacteria) starts growing in the wound. In some cases, this can cause the wound to break open. If cared for properly, the infected wound will heal from the inside to the outside. Wound infections need treatment. °CAUSES °An infection is caused by bacteria growing in the wound.  °SYMPTOMS  °· Increase in redness, swelling, or pain at the wound site. °· Increase in drainage at the wound site. °· Wound or bandage (dressing) starts to smell bad. °· Fever. °· Feeling tired or fatigued. °· Pus draining from the wound. °TREATMENT  °Your health care provider will prescribe antibiotic medicine. The wound infection should improve within 24 to 48 hours. Any redness around the wound should stop spreading and the wound should be less painful.  °HOME CARE INSTRUCTIONS  °· Only take over-the-counter or prescription medicines for pain, discomfort, or fever as directed by your health care provider. °· Take your antibiotics as directed. Finish them even if you start to feel better. °· Gently wash the area with mild soap and water 2 times a day, or as directed. Rinse off the soap. Pat the area dry with a clean towel. Do not rub the wound. This may cause bleeding. °· Follow your health care provider's instructions for how often you need to change the dressing. °· Apply ointment and a dressing to the wound as directed. °· If the dressing sticks, moisten it with soapy water and gently remove it. °· Change the bandage right away if it becomes wet, dirty, or develops a bad smell. °· Take showers. Do not take tub baths, swim, or do anything that may soak the wound until it is healed. °· Avoid exercises that make you sweat heavily. °· Use anti-itch medicine as directed by your health care provider. The wound may itch when it is healing. Do not pick or scratch at the wound. °· Follow up with your health care provider to get your wound rechecked as directed. °SEEK MEDICAL CARE  IF: °· You have an increase in swelling, pain, or redness around the wound. °· You have an increase in the amount of pus coming from the wound. °· There is a bad smell coming from the wound. °· More of the wound breaks open. °· You have a fever. °MAKE SURE YOU:  °· Understand these instructions. °· Will watch your condition. °· Will get help right away if you are not doing well or get worse. °Document Released: 09/10/2003 Document Revised: 12/17/2013 Document Reviewed: 04/17/2011 °[employer]® Patient Information ©2015 [employer]. This information is not intended to replace advice given to you by your health care provider. Make sure you discuss any questions you have with your health care provider. ° °

## 2015-01-23 ENCOUNTER — Other Ambulatory Visit: Payer: Commercial Managed Care - HMO

## 2015-01-23 DIAGNOSIS — T798XXA Other early complications of trauma, initial encounter: Secondary | ICD-10-CM

## 2015-01-26 LAB — WOUND CULTURE: Gram Stain: NONE SEEN

## 2015-01-29 ENCOUNTER — Ambulatory Visit: Payer: Commercial Managed Care - HMO | Admitting: Internal Medicine

## 2015-02-02 ENCOUNTER — Ambulatory Visit (INDEPENDENT_AMBULATORY_CARE_PROVIDER_SITE_OTHER): Payer: Commercial Managed Care - HMO | Admitting: Internal Medicine

## 2015-02-02 ENCOUNTER — Encounter: Payer: Self-pay | Admitting: Internal Medicine

## 2015-02-02 VITALS — BP 138/88 | HR 94 | Temp 97.7°F | Resp 16 | Wt 96.0 lb

## 2015-02-02 DIAGNOSIS — T798XXD Other early complications of trauma, subsequent encounter: Secondary | ICD-10-CM | POA: Diagnosis not present

## 2015-02-02 NOTE — Assessment & Plan Note (Signed)
Improvement noted I have asked her to leave the scab alone There is no evidence of infection at this time

## 2015-02-02 NOTE — Progress Notes (Signed)
   Subjective:    Patient ID: Dossie Arbour, female    DOB: 01-08-1930, 81 y.o.   MRN: [id number]  Wound Check She was originally treated 5 to 10 days ago. Previous treatment included oral antibiotics and laceration repair. Her temperature was unmeasured prior to arrival. There has been no drainage from the wound. There is no redness present. There is no swelling present. The pain has no pain. She has no difficulty moving the affected extremity or digit.      Review of Systems  All other systems reviewed and are negative.      Objective:   Physical Exam  Skin:             Assessment & Plan:

## 2015-02-02 NOTE — Progress Notes (Signed)
Pre visit review using our clinic review tool, if applicable. No additional management support is needed unless otherwise documented below in the visit note. 

## 2015-03-10 ENCOUNTER — Telehealth: Payer: Self-pay

## 2015-03-10 NOTE — Telephone Encounter (Signed)
Spouse stated patient did not have a flu shot, but most likely did not take one anywhere else. Instructed to call to confirm receipt of flu vaccine or the patient can receive a flu vaccine at the office until 03/25/2014.

## 2015-06-24 ENCOUNTER — Telehealth: Payer: Self-pay

## 2015-06-24 DIAGNOSIS — M150 Primary generalized (osteo)arthritis: Principal | ICD-10-CM

## 2015-06-24 DIAGNOSIS — M159 Polyosteoarthritis, unspecified: Secondary | ICD-10-CM

## 2015-06-24 MED ORDER — TRAMADOL HCL 50 MG PO TABS: 50.0000 mg | ORAL_TABLET | Freq: Two times a day (BID) | ORAL | Status: DC | PRN

## 2015-06-24 NOTE — Telephone Encounter (Signed)
done

## 2015-06-24 NOTE — Telephone Encounter (Signed)
Received refill request from [facility] request refills for Tramadol 50 mg . Rx last written 08/13/2012 and pt last seen 02/02/2015   . Please advise Thanks

## 2015-06-24 NOTE — Telephone Encounter (Signed)
Rx faxed to pharmacy  

## 2015-07-02 ENCOUNTER — Encounter: Payer: Self-pay | Admitting: Internal Medicine

## 2015-07-02 ENCOUNTER — Other Ambulatory Visit: Payer: Self-pay | Admitting: Internal Medicine

## 2015-07-02 ENCOUNTER — Other Ambulatory Visit (INDEPENDENT_AMBULATORY_CARE_PROVIDER_SITE_OTHER): Payer: Commercial Managed Care - HMO

## 2015-07-02 ENCOUNTER — Ambulatory Visit (INDEPENDENT_AMBULATORY_CARE_PROVIDER_SITE_OTHER): Payer: Commercial Managed Care - HMO | Admitting: Internal Medicine

## 2015-07-02 VITALS — BP 112/70 | HR 98 | Temp 98.5°F | Resp 16 | Ht 66.0 in | Wt 98.3 lb

## 2015-07-02 DIAGNOSIS — R739 Hyperglycemia, unspecified: Secondary | ICD-10-CM

## 2015-07-02 DIAGNOSIS — E785 Hyperlipidemia, unspecified: Secondary | ICD-10-CM

## 2015-07-02 DIAGNOSIS — B029 Zoster without complications: Secondary | ICD-10-CM | POA: Diagnosis not present

## 2015-07-02 DIAGNOSIS — I10 Essential (primary) hypertension: Secondary | ICD-10-CM | POA: Diagnosis not present

## 2015-07-02 DIAGNOSIS — E538 Deficiency of other specified B group vitamins: Secondary | ICD-10-CM

## 2015-07-02 DIAGNOSIS — E559 Vitamin D deficiency, unspecified: Secondary | ICD-10-CM | POA: Diagnosis not present

## 2015-07-02 LAB — CBC WITH DIFFERENTIAL/PLATELET
Basophils Absolute: 0 10*3/uL (ref 0.0–0.1)
Basophils Relative: 0.3 % (ref 0.0–3.0)
EOS PCT: 1 % (ref 0.0–5.0)
Eosinophils Absolute: 0.1 10*3/uL (ref 0.0–0.7)
HEMATOCRIT: 42.5 % (ref 36.0–46.0)
Hemoglobin: 13.9 g/dL (ref 12.0–15.0)
LYMPHS ABS: 1.1 10*3/uL (ref 0.7–4.0)
Lymphocytes Relative: 12.3 % (ref 12.0–46.0)
MCHC: 32.7 g/dL (ref 30.0–36.0)
MCV: 88.7 fl (ref 78.0–100.0)
MONOS PCT: 17.3 % — AB (ref 3.0–12.0)
Monocytes Absolute: 1.5 10*3/uL — ABNORMAL HIGH (ref 0.1–1.0)
NEUTROS PCT: 69.1 % (ref 43.0–77.0)
Neutro Abs: 6 10*3/uL (ref 1.4–7.7)
PLATELETS: 248 10*3/uL (ref 150.0–400.0)
RBC: 4.8 Mil/uL (ref 3.87–5.11)
RDW: 15.5 % (ref 11.5–15.5)
WBC: 8.6 10*3/uL (ref 4.0–10.5)

## 2015-07-02 LAB — COMPREHENSIVE METABOLIC PANEL
ALBUMIN: 3.8 g/dL (ref 3.5–5.2)
ALK PHOS: 71 U/L (ref 39–117)
ALT: 25 U/L (ref 0–35)
AST: 20 U/L (ref 0–37)
BUN: 16 mg/dL (ref 6–23)
CHLORIDE: 102 meq/L (ref 96–112)
CO2: 28 mEq/L (ref 19–32)
Calcium: 9.6 mg/dL (ref 8.4–10.5)
Creatinine, Ser: 1.06 mg/dL (ref 0.40–1.20)
GFR: 52.59 mL/min — ABNORMAL LOW (ref >60.00)
GLUCOSE: 98 mg/dL (ref 70–99)
Potassium: 4.9 mEq/L (ref 3.5–5.1)
Sodium: 137 mEq/L (ref 135–145)
TOTAL PROTEIN: 6.8 g/dL (ref 6.0–8.3)
Total Bilirubin: 0.3 mg/dL (ref 0.2–1.2)

## 2015-07-02 LAB — LIPID PANEL
Cholesterol: 222 mg/dL — ABNORMAL HIGH (ref 0–200)
HDL: 102.5 mg/dL (ref >39.00)
LDL Cholesterol: 94 mg/dL (ref 0–99)
NONHDL: 119.5
Total CHOL/HDL Ratio: 2
Triglycerides: 129 mg/dL (ref 0.0–149.0)
VLDL: 25.8 mg/dL (ref 0.0–40.0)

## 2015-07-02 LAB — FOLATE: Folate: >24.8 ng/mL (ref >5.9)

## 2015-07-02 LAB — VITAMIN B12: Vitamin B-12: 298 pg/mL (ref 211–911)

## 2015-07-02 LAB — HEMOGLOBIN A1C: HEMOGLOBIN A1C: 5.6 % (ref 4.6–6.5)

## 2015-07-02 LAB — VITAMIN D 25 HYDROXY (VIT D DEFICIENCY, FRACTURES): VITD: 9.76 ng/mL — ABNORMAL LOW (ref 30.00–100.00)

## 2015-07-02 MED ORDER — CHOLECALCIFEROL 50 MCG (2000 UT) PO TABS: 1.0000 | ORAL_TABLET | Freq: Every day | ORAL | Status: DC

## 2015-07-02 MED ORDER — OXYCODONE-ACETAMINOPHEN 5-325 MG PO TABS: 1.0000 | ORAL_TABLET | ORAL | Status: DC | PRN

## 2015-07-02 MED ORDER — CYANOCOBALAMIN 2000 MCG PO TABS
2000.0000 ug | ORAL_TABLET | Freq: Every day | ORAL | Status: DC
Start: 2015-07-02 — End: 2017-12-18

## 2015-07-02 MED ORDER — VALACYCLOVIR HCL 1 G PO TABS: 1000.0000 mg | ORAL_TABLET | Freq: Two times a day (BID) | ORAL | Status: AC

## 2015-07-02 NOTE — Patient Instructions (Signed)

## 2015-07-02 NOTE — Progress Notes (Signed)
Subjective:  Patient ID: Kaylee Li, female    DOB: [birth year]  Age: 81 y.o. MRN: [id number]  CC: Rash   HPI [name] presents for a 1 week hx of painful rash over her left upper back and into her left chest area, she has tried tramadol for the pain but it does not help much, the pain keeps her awake.  Outpatient Prescriptions Prior to Visit  Medication Sig Dispense Refill  . acetaminophen (TYLENOL) 500 MG tablet Take 1,000 mg by mouth 3 (three) times daily.     Marland Kitchen aspirin 81 MG tablet Take 81 mg by mouth daily.      Marland Kitchen desonide (DESOWEN) 0.05 % lotion Apply topically 2 (two) times daily. 59 mL 11  . losartan (COZAAR) 50 MG tablet Take 1 tablet (50 mg total) by mouth daily. 90 tablet 3  . pravastatin (PRAVACHOL) 40 MG tablet Take 1 tablet (40 mg total) by mouth daily. 90 tablet 3  . traMADol (ULTRAM) 50 MG tablet Take 1 tablet (50 mg total) by mouth every 12 (twelve) hours as needed. 60 tablet 5   No facility-administered medications prior to visit.    ROS Review of Systems  Constitutional: Negative.  Negative for fever, chills, diaphoresis, appetite change, fatigue and unexpected weight change.  HENT: Negative.  Negative for facial swelling, trouble swallowing and voice change.   Eyes: Negative.   Respiratory: Negative.  Negative for cough, choking, shortness of breath and stridor.   Cardiovascular: Negative.  Negative for chest pain, palpitations and leg swelling.  Gastrointestinal: Negative.  Negative for nausea, vomiting, abdominal pain, diarrhea, constipation and blood in stool.  Endocrine: Negative.   Genitourinary: Negative.   Musculoskeletal: Positive for arthralgias. Negative for myalgias, back pain, joint swelling and neck pain.  Skin: Positive for rash.  Allergic/Immunologic: Negative.   Neurological: Negative.   Hematological: Negative.  Negative for adenopathy. Does not bruise/bleed easily.  Psychiatric/Behavioral: Negative.     Objective:  BP 112/70 mmHg   Pulse 98  Temp(Src) 98.5 F (36.9 C) (Oral)  Resp 16  Ht 5\' 6"  (1.676 m)  Wt 98 lb 5 oz (44.594 kg)  BMI 15.88 kg/m2  SpO2 95%  BP Readings from Last 3 Encounters:  07/02/15 112/70  02/02/15 138/88  01/22/15 138/88    Wt Readings from Last 3 Encounters:  07/02/15 98 lb 5 oz (44.594 kg)  02/02/15 96 lb (43.545 kg)  01/22/15 95 lb (43.092 kg)    Physical Exam  Constitutional: She is oriented to person, place, and time. She appears well-developed and well-nourished. No distress.  HENT:  Mouth/Throat: Oropharynx is clear and moist. No oropharyngeal exudate.  Eyes: Conjunctivae are normal. Right eye exhibits no discharge. Left eye exhibits no discharge. No scleral icterus.  Neck: Normal range of motion. Neck supple. No JVD present. No tracheal deviation present. No thyromegaly present.  Cardiovascular: Normal rate, regular rhythm, normal heart sounds and intact distal pulses.  Exam reveals no gallop and no friction rub.   No murmur heard. Pulmonary/Chest: Effort normal and breath sounds normal. No stridor. No respiratory distress. She has no wheezes. She has no rales. She exhibits no tenderness.  Abdominal: Soft. Bowel sounds are normal. She exhibits no distension and no mass. There is no tenderness. There is no rebound and no guarding.  Musculoskeletal: Normal range of motion. She exhibits no edema or tenderness.  Lymphadenopathy:       Head (right side): No submandibular adenopathy present.       Head (left  side): No submandibular adenopathy present.    She has no cervical adenopathy.       Right cervical: No superficial cervical, no deep cervical and no posterior cervical adenopathy present.      Left cervical: No superficial cervical, no deep cervical and no posterior cervical adenopathy present.    She has axillary adenopathy.       Right axillary: No pectoral and no lateral adenopathy present.       Left axillary: Lateral adenopathy present. No pectoral adenopathy present.       Right: No supraclavicular and no epitrochlear adenopathy present.       Left: No supraclavicular and no epitrochlear adenopathy present.  Neurological: She is oriented to person, place, and time.  Skin: Skin is warm, dry and intact. Rash noted. No purpura noted. Rash is vesicular. Rash is not macular, not papular, not maculopapular, not nodular, not pustular and not urticarial. She is not diaphoretic. No pallor.     Psychiatric: She has a normal mood and affect. Her behavior is normal. Judgment and thought content normal.  Vitals reviewed.   Lab Results  Component Value Date   WBC 9.9 10/02/2014   HGB 12.8 10/02/2014   HCT 40.1 10/02/2014   PLT 319 10/02/2014   GLUCOSE 117* 06/02/2014   CHOL 229* 06/02/2014   TRIG 144.0 06/02/2014   HDL 98.00 06/02/2014   LDLDIRECT 146.8 06/01/2011   LDLCALC 102* 06/02/2014   ALT 16 06/02/2014   AST 16 06/02/2014   NA 138 06/02/2014   K 4.5 06/02/2014   CL 105 06/02/2014   CREATININE 0.8 06/02/2014   BUN 19 06/02/2014   CO2 25 06/02/2014   TSH 1.40 06/02/2014   HGBA1C 5.6 06/02/2014    No results found.  Assessment & Plan:   Emmagrace was seen today for rash.  Diagnoses and all orders for this visit:  Shingles rash- will treat with valtrex Orders: -     CBC with Differential/Platelet; Future -     valACYclovir (VALTREX) 1000 MG tablet; Take 1 tablet (1,000 mg total) by mouth 2 (two) times daily. -     oxyCODONE-acetaminophen (ROXICET) 5-325 MG per tablet; Take 1 tablet by mouth every 4 (four) hours as needed for severe pain.  Essential hypertension- her BP is well controlled, will monitor her lytes and renal function  Orders: -     Comprehensive metabolic panel; Future  B12 deficiency Orders: -     CBC with Differential/Platelet; Future -     Vitamin B12; Future -     Folate; Future  Hyperlipidemia with target LDL less than 130 Orders: -     Lipid panel; Future  Vitamin D deficiency Orders: -     Vit D  25 hydroxy (rtn  osteoporosis monitoring); Future  Hyperglycemia- will check her A1C to see if she has developed DM2 Orders: -     Comprehensive metabolic panel; Future -     Hemoglobin A1c; Future  I have discontinued Ms. Hodges traMADol. I am also having her start on valACYclovir and oxyCODONE-acetaminophen. Additionally, I am having her maintain her aspirin, acetaminophen, losartan, pravastatin, and desonide.  Meds ordered this encounter  Medications  . valACYclovir (VALTREX) 1000 MG tablet    Sig: Take 1 tablet (1,000 mg total) by mouth 2 (two) times daily.    Dispense:  14 tablet    Refill:  1  . oxyCODONE-acetaminophen (ROXICET) 5-325 MG per tablet    Sig: Take 1 tablet by mouth every 4 (  four) hours as needed for severe pain.    Dispense:  60 tablet    Refill:  0     Follow-up: Return in about 3 weeks (around 07/23/2015).  Scarlette Calico, MD

## 2015-07-02 NOTE — Progress Notes (Signed)
Pre visit review using our clinic review tool, if applicable. No additional management support is needed unless otherwise documented below in the visit note. 

## 2015-07-14 ENCOUNTER — Other Ambulatory Visit: Payer: Self-pay

## 2015-07-14 DIAGNOSIS — B029 Zoster without complications: Secondary | ICD-10-CM

## 2015-07-14 NOTE — Telephone Encounter (Signed)
Ok to RF? 

## 2015-07-23 ENCOUNTER — Telehealth: Payer: Self-pay | Admitting: Internal Medicine

## 2015-07-23 ENCOUNTER — Ambulatory Visit: Payer: Self-pay | Admitting: Internal Medicine

## 2015-07-23 MED ORDER — PREGABALIN 50 MG PO CAPS: 50.0000 mg | ORAL_CAPSULE | Freq: Three times a day (TID) | ORAL | Status: DC

## 2015-07-23 NOTE — Telephone Encounter (Signed)
Rx faxed to pharmacy  

## 2015-07-23 NOTE — Telephone Encounter (Signed)
Pt son called in said that she is still in pain and not getting better.  I made her an appt from day.  They could not get transportation before then.  He wanted to know if there is anything that can be called in to help her make it though the weekend.  Or he has a scooter that he can drive up here an put on script for her. ??    Best number [phone]- Trip

## 2015-07-23 NOTE — Telephone Encounter (Signed)
Please fax a prescription for Lyrica to her pharmacy.

## 2015-07-24 NOTE — Telephone Encounter (Signed)
pts son called back in wanting to let you know that she's feeling better and her rash seems to be scaling over. He will also not be picking up the prescription.

## 2015-07-27 ENCOUNTER — Ambulatory Visit: Payer: Self-pay | Admitting: Internal Medicine

## 2015-09-02 ENCOUNTER — Encounter: Payer: Self-pay | Admitting: Internal Medicine

## 2015-09-02 ENCOUNTER — Ambulatory Visit (INDEPENDENT_AMBULATORY_CARE_PROVIDER_SITE_OTHER): Payer: Commercial Managed Care - HMO | Admitting: Internal Medicine

## 2015-09-02 VITALS — BP 133/88 | HR 100 | Temp 98.6°F | Resp 16 | Ht 66.0 in | Wt 94.0 lb

## 2015-09-02 DIAGNOSIS — L219 Seborrheic dermatitis, unspecified: Secondary | ICD-10-CM

## 2015-09-02 DIAGNOSIS — Z23 Encounter for immunization: Secondary | ICD-10-CM | POA: Diagnosis not present

## 2015-09-02 DIAGNOSIS — M150 Primary generalized (osteo)arthritis: Secondary | ICD-10-CM

## 2015-09-02 DIAGNOSIS — M159 Polyosteoarthritis, unspecified: Secondary | ICD-10-CM

## 2015-09-02 DIAGNOSIS — B029 Zoster without complications: Secondary | ICD-10-CM | POA: Diagnosis not present

## 2015-09-02 MED ORDER — OXYCODONE-ACETAMINOPHEN 5-325 MG PO TABS: 1.0000 | ORAL_TABLET | ORAL | Status: DC | PRN

## 2015-09-02 MED ORDER — KETOCONAZOLE 2 % EX CREA: 1.0000 "application " | TOPICAL_CREAM | Freq: Two times a day (BID) | CUTANEOUS | Status: DC

## 2015-09-02 NOTE — Patient Instructions (Signed)
Seborrheic Dermatitis °Seborrheic dermatitis involves pink or red skin with greasy, flaky scales. This is often found on the scalp, eyebrows, nose, bearded area, and on or behind the ears. It can also occur on the central chest. It often occurs where there are more oil (sebaceous) glands. This condition is also known as dandruff. When this condition affects a baby's scalp, it is called cradle cap. It may come and go for no known reason. It can occur at any time of life from infancy to old age. °CAUSES  °The cause is unknown. It is not the result of too little moisture or too much oil. In some people, seborrheic dermatitis flare-ups seem to be triggered by stress. It also commonly occurs in people with certain diseases such as Parkinson's disease or HIV/AIDS. °SYMPTOMS  °· Thick scales on the scalp. °· Redness on the face or in the armpits. °· The skin may seem oily or dry, but moisturizers do not help. °· In infants, seborrheic dermatitis appears as scaly redness that does not seem to bother the baby. In some babies, it affects only the scalp. In others, it also affects the neck creases, armpits, groin, or behind the ears. °· In adults and adolescents, seborrheic dermatitis may affect only the scalp. It may look patchy or spread out, with areas of redness and flaking. Other areas commonly affected include: °¨ Eyebrows. °¨ Eyelids. °¨ Forehead. °¨ Skin behind the ears. °¨ Outer ears. °¨ Chest. °¨ Armpits. °¨ Nose creases. °¨ Skin creases under the breasts. °¨ Skin between the buttocks. °¨ Groin. °· Some adults and adolescents feel itching or burning in the affected areas. °DIAGNOSIS  °Your caregiver can usually tell what the problem is by doing a physical exam. °TREATMENT  °· Cortisone (steroid) ointments, creams, and lotions can help decrease inflammation. °· Babies can be treated with baby oil to soften the scales, then they may be washed with baby shampoo. If this does not help, a prescription topical steroid  medicine may work. °· Adults can use medicated shampoos. °· Your caregiver may prescribe corticosteroid cream and shampoo containing an antifungal or yeast medicine (ketoconazole). Hydrocortisone or anti-yeast cream can be rubbed directly onto seborrheic dermatitis patches. Yeast does not cause seborrheic dermatitis, but it seems to add to the problem. °In infants, seborrheic dermatitis is often worst during the first year of life. It tends to disappear on its own as the child grows. However, it may return during the teenage years. In adults and adolescents, seborrheic dermatitis tends to be a long-lasting condition that comes and goes over many years. °HOME CARE INSTRUCTIONS  °· Use prescribed medicines as directed. °· In infants, do not aggressively remove the scales or flakes on the scalp with a comb or by other means. This may lead to hair loss. °SEEK MEDICAL CARE IF:  °· The problem does not improve from the medicated shampoos, lotions, or other medicines given by your caregiver. °· You have any other questions or concerns. °Document Released: 12/12/2005 Document Revised: 06/12/2012 Document Reviewed: 05/03/2010 °[employer]® Patient Information ©2015 [employer]. This information is not intended to replace advice given to you by your health care provider. Make sure you discuss any questions you have with your health care provider. ° °

## 2015-09-02 NOTE — Progress Notes (Signed)
Pre visit review using our clinic review tool, if applicable. No additional management support is needed unless otherwise documented below in the visit note. 

## 2015-09-03 NOTE — Progress Notes (Signed)
Subjective:  Patient ID: Kaylee Li, female    DOB: 14-Aug-1930  Age: 81 y.o. MRN: [id number]  CC: Rash and Osteoarthritis   HPI Kaylee Li presents for for follow-up. The rash on her torso, the shingles, has resolved. Today she complains of a recurrent irritated rash around her nose and the left side of her face. She has tried a low potency topical steroid with minimal relief from the rash symptoms. She also complains of chronic arthritis pain and wants a refill on Percocet.  Outpatient Prescriptions Prior to Visit  Medication Sig Dispense Refill  . acetaminophen (TYLENOL) 500 MG tablet Take 1,000 mg by mouth 3 (three) times daily.     Marland Kitchen aspirin 81 MG tablet Take 81 mg by mouth daily.      . Cholecalciferol 2000 UNITS TABS Take 1 tablet (2,000 Units total) by mouth daily. 90 tablet 3  . desonide (DESOWEN) 0.05 % lotion Apply topically 2 (two) times daily. 59 mL 11  . losartan (COZAAR) 50 MG tablet Take 1 tablet (50 mg total) by mouth daily. 90 tablet 3  . pravastatin (PRAVACHOL) 40 MG tablet Take 1 tablet (40 mg total) by mouth daily. 90 tablet 3  . pregabalin (LYRICA) 50 MG capsule Take 1 capsule (50 mg total) by mouth 3 (three) times daily. 90 capsule 2  . cyanocobalamin 2000 MCG tablet Take 1 tablet (2,000 mcg total) by mouth daily. (Patient not taking: Reported on 09/02/2015) 90 tablet 3  . oxyCODONE-acetaminophen (ROXICET) 5-325 MG per tablet Take 1 tablet by mouth every 4 (four) hours as needed for severe pain. (Patient not taking: Reported on 09/02/2015) 60 tablet 0   No facility-administered medications prior to visit.    ROS Review of Systems  Constitutional: Negative.   HENT: Negative.   Eyes: Negative.   Respiratory: Negative.  Negative for cough, choking, chest tightness, shortness of breath and stridor.   Cardiovascular: Negative.  Negative for chest pain, palpitations and leg swelling.  Gastrointestinal: Negative.  Negative for nausea, vomiting, abdominal pain, diarrhea,  constipation and blood in stool.  Endocrine: Negative.   Genitourinary: Negative.   Musculoskeletal: Positive for arthralgias. Negative for back pain and neck pain.  Skin: Positive for rash.  Allergic/Immunologic: Negative.   Neurological: Negative.   Hematological: Negative.  Negative for adenopathy. Does not bruise/bleed easily.  Psychiatric/Behavioral: Negative.     Objective:  BP 133/88 mmHg  Pulse 100  Temp(Src) 98.6 F (37 C) (Oral)  Resp 16  Ht 5\' 6"  (1.676 m)  Wt 94 lb (42.638 kg)  BMI 15.18 kg/m2  SpO2 94%  BP Readings from Last 3 Encounters:  09/02/15 133/88  07/02/15 112/70  02/02/15 138/88    Wt Readings from Last 3 Encounters:  09/02/15 94 lb (42.638 kg)  07/02/15 98 lb 5 oz (44.594 kg)  02/02/15 96 lb (43.545 kg)    Physical Exam  Constitutional: She is oriented to person, place, and time. No distress.  HENT:  Head:    Nose: No mucosal edema, rhinorrhea, sinus tenderness or nasal deformity.  Mouth/Throat: Oropharynx is clear and moist. No oropharyngeal exudate.  Over the face there is a rash, is noted in the nasolabial folds and on the left malar surface. There is an erythema and scale. There are no pustules or papules.  Eyes: Conjunctivae are normal. Left eye exhibits no discharge. No scleral icterus.  Neck: Normal range of motion. Neck supple. No JVD present. No tracheal deviation present. No thyromegaly present.  Cardiovascular: Normal rate, regular  rhythm, normal heart sounds and intact distal pulses.  Exam reveals no gallop and no friction rub.   No murmur heard. Pulmonary/Chest: Effort normal and breath sounds normal. No stridor. No respiratory distress. She has no wheezes. She has no rales. She exhibits no tenderness.  Abdominal: Soft. Bowel sounds are normal. She exhibits no distension and no mass. There is no tenderness. There is no rebound and no guarding.  Musculoskeletal: Normal range of motion. She exhibits no edema or tenderness.        Right knee: She exhibits deformity (DJD). She exhibits normal range of motion, no swelling, no effusion and no ecchymosis. No tenderness found.       Left knee: She exhibits deformity (DJD). She exhibits normal range of motion, no swelling, no effusion, no ecchymosis and no laceration. No tenderness found.  Lymphadenopathy:    She has no cervical adenopathy.  Neurological: She is oriented to person, place, and time.  Skin: Skin is warm and dry. Rash noted. She is not diaphoretic. No erythema. No pallor.    Lab Results  Component Value Date   WBC 8.6 07/02/2015   HGB 13.9 07/02/2015   HCT 42.5 07/02/2015   PLT 248.0 07/02/2015   GLUCOSE 98 07/02/2015   CHOL 222* 07/02/2015   TRIG 129.0 07/02/2015   HDL 102.50 07/02/2015   LDLDIRECT 146.8 06/01/2011   LDLCALC 94 07/02/2015   ALT 25 07/02/2015   AST 20 07/02/2015   NA 137 07/02/2015   K 4.9 07/02/2015   CL 102 07/02/2015   CREATININE 1.06 07/02/2015   BUN 16 07/02/2015   CO2 28 07/02/2015   TSH 1.40 06/02/2014   HGBA1C 5.6 07/02/2015    No results found.  Assessment & Plan:   Kaylee Li was seen today for rash and osteoarthritis.  Diagnoses and all orders for this visit:  Acute seborrheic dermatitis- she will continue the DesOwen lotion but that only treats the symptoms. Will treat the infection with ketoconazole cream. -     ketoconazole (NIZORAL) 2 % cream; Apply 1 application topically 2 (two) times daily.  Primary osteoarthritis involving multiple joints- she will continue Percocet as needed for pain. -     oxyCODONE-acetaminophen (ROXICET) 5-325 MG per tablet; Take 1 tablet by mouth every 4 (four) hours as needed for severe pain.  Shingles rash- this has resolved, she has stopped taking Lyrica and has no symptoms of postherpetic neuralgia or other complications.  Need for influenza vaccination -     Flu vaccine HIGH DOSE PF   I have discontinued Kaylee Li pregabalin and traMADol. I am also having her start on  ketoconazole. Additionally, I am having her maintain her aspirin, acetaminophen, losartan, pravastatin, desonide, Cholecalciferol, cyanocobalamin, and oxyCODONE-acetaminophen.  Meds ordered this encounter  Medications  . DISCONTD: traMADol (ULTRAM) 50 MG tablet    Sig:   . ketoconazole (NIZORAL) 2 % cream    Sig: Apply 1 application topically 2 (two) times daily.    Dispense:  60 g    Refill:  2  . oxyCODONE-acetaminophen (ROXICET) 5-325 MG per tablet    Sig: Take 1 tablet by mouth every 4 (four) hours as needed for severe pain.    Dispense:  60 tablet    Refill:  0     Follow-up: Return in about 3 months (around 12/02/2015).  Scarlette Calico, MD

## 2015-09-24 ENCOUNTER — Other Ambulatory Visit: Payer: Self-pay

## 2015-09-24 ENCOUNTER — Telehealth: Payer: Self-pay | Admitting: Internal Medicine

## 2015-09-24 DIAGNOSIS — I10 Essential (primary) hypertension: Secondary | ICD-10-CM

## 2015-09-24 DIAGNOSIS — E785 Hyperlipidemia, unspecified: Secondary | ICD-10-CM

## 2015-09-24 MED ORDER — PRAVASTATIN SODIUM 40 MG PO TABS: 40.0000 mg | ORAL_TABLET | Freq: Every day | ORAL | Status: DC

## 2015-09-24 MED ORDER — LOSARTAN POTASSIUM 50 MG PO TABS: 50.0000 mg | ORAL_TABLET | Freq: Every day | ORAL | Status: DC

## 2015-09-24 NOTE — Telephone Encounter (Signed)
done

## 2015-09-24 NOTE — Telephone Encounter (Signed)
pts son called stating Kristopher Oppenheim on [address] requested refills for losartan (COZAAR) 50 MG tablet [[id number]] and pravastatin (PRAVACHOL) 40 MG tablet [[id number]]

## 2015-12-04 ENCOUNTER — Other Ambulatory Visit: Payer: Self-pay | Admitting: Internal Medicine

## 2015-12-04 ENCOUNTER — Telehealth: Payer: Self-pay | Admitting: Internal Medicine

## 2015-12-04 DIAGNOSIS — M150 Primary generalized (osteo)arthritis: Principal | ICD-10-CM

## 2015-12-04 DIAGNOSIS — L219 Seborrheic dermatitis, unspecified: Secondary | ICD-10-CM

## 2015-12-04 DIAGNOSIS — M159 Polyosteoarthritis, unspecified: Secondary | ICD-10-CM

## 2015-12-04 NOTE — Telephone Encounter (Signed)
Ok to fill 

## 2015-12-04 NOTE — Telephone Encounter (Signed)
Pt's Son, Smya Mcgarrigle, came by requesting a refill for the pt on her oxycodone.

## 2015-12-04 NOTE — Telephone Encounter (Signed)
States Dr. Ronnald Ramp has seen patient for a rash that she has had on her face.  The rash has came back and is requesting referral to dermatologist.

## 2015-12-04 NOTE — Telephone Encounter (Signed)
The was last prescribed on 9/7 with 0 rfs. The pt herself is not requesting this med. Should we schedule a f/u?

## 2015-12-05 MED ORDER — OXYCODONE-ACETAMINOPHEN 5-325 MG PO TABS: 1.0000 | ORAL_TABLET | ORAL | Status: DC | PRN

## 2015-12-05 NOTE — Telephone Encounter (Signed)
Referral ordered

## 2015-12-07 NOTE — Telephone Encounter (Signed)
Pt informed

## 2015-12-14 ENCOUNTER — Ambulatory Visit (INDEPENDENT_AMBULATORY_CARE_PROVIDER_SITE_OTHER): Payer: Commercial Managed Care - HMO | Admitting: Internal Medicine

## 2015-12-14 ENCOUNTER — Encounter: Payer: Self-pay | Admitting: Internal Medicine

## 2015-12-14 VITALS — BP 140/92 | HR 80 | Temp 98.2°F | Resp 16 | Ht 66.0 in | Wt 95.0 lb

## 2015-12-14 DIAGNOSIS — L219 Seborrheic dermatitis, unspecified: Secondary | ICD-10-CM | POA: Diagnosis not present

## 2015-12-14 DIAGNOSIS — M150 Primary generalized (osteo)arthritis: Secondary | ICD-10-CM

## 2015-12-14 DIAGNOSIS — M159 Polyosteoarthritis, unspecified: Secondary | ICD-10-CM

## 2015-12-14 DIAGNOSIS — I10 Essential (primary) hypertension: Secondary | ICD-10-CM | POA: Diagnosis not present

## 2015-12-14 MED ORDER — KETOCONAZOLE 2 % EX CREA: 1.0000 "application " | TOPICAL_CREAM | Freq: Two times a day (BID) | CUTANEOUS | Status: DC

## 2015-12-14 NOTE — Progress Notes (Signed)
Subjective:  Patient ID: Kaylee Li, female    DOB: 11-09-1930  Age: 81 y.o. MRN: [id number]  CC: Osteoarthritis and Rash   HPI Kaylee Li presents for a follow-up on facial rash. She has a history of seborrheic dermatitis. Her son is with her today and tells me that she is only using one cream on her face and he thinks it's a steroid. The family called a few weeks ago and asked for referral to dermatology which has been ordered. They tell me she has not seen a dermatologist yet. She applies it and it provides temporary relief but the rash and symptoms do not go away. He said he doesn't think she's using the Nizoral cream on her face. She also complains of arthritis pain and needs a refill on Percocet.  Outpatient Prescriptions Prior to Visit  Medication Sig Dispense Refill  . acetaminophen (TYLENOL) 500 MG tablet Take 1,000 mg by mouth 3 (three) times daily.     Marland Kitchen aspirin 81 MG tablet Take 81 mg by mouth daily.      . Cholecalciferol 2000 UNITS TABS Take 1 tablet (2,000 Units total) by mouth daily. 90 tablet 3  . cyanocobalamin 2000 MCG tablet Take 1 tablet (2,000 mcg total) by mouth daily. 90 tablet 3  . desonide (DESOWEN) 0.05 % lotion Apply topically 2 (two) times daily. 59 mL 11  . losartan (COZAAR) 50 MG tablet Take 1 tablet (50 mg total) by mouth daily. 90 tablet 3  . oxyCODONE-acetaminophen (ROXICET) 5-325 MG tablet Take 1 tablet by mouth every 4 (four) hours as needed for severe pain. 60 tablet 0  . pravastatin (PRAVACHOL) 40 MG tablet Take 1 tablet (40 mg total) by mouth daily. 90 tablet 3  . ketoconazole (NIZORAL) 2 % cream Apply 1 application topically 2 (two) times daily. 60 g 2   No facility-administered medications prior to visit.    ROS Review of Systems  Constitutional: Negative.  Negative for fever, chills, diaphoresis, appetite change and fatigue.  HENT: Negative.   Eyes: Negative.   Respiratory: Negative.  Negative for cough, choking, chest tightness, shortness of  breath and stridor.   Cardiovascular: Negative.  Negative for chest pain, palpitations and leg swelling.  Gastrointestinal: Negative.  Negative for nausea, vomiting, abdominal pain, diarrhea, constipation and blood in stool.  Endocrine: Negative.   Genitourinary: Negative.   Musculoskeletal: Positive for arthralgias.  Skin: Positive for rash. Negative for color change, pallor and wound.  Allergic/Immunologic: Negative.   Neurological: Negative.   Hematological: Negative.  Negative for adenopathy. Does not bruise/bleed easily.  Psychiatric/Behavioral: Negative.     Objective:  BP 140/92 mmHg  Pulse 80  Temp(Src) 98.2 F (36.8 C) (Oral)  Resp 16  Ht 5\' 6"  (1.676 m)  Wt 95 lb (43.092 kg)  BMI 15.34 kg/m2  SpO2 95%  BP Readings from Last 3 Encounters:  12/14/15 140/92  09/02/15 133/88  07/02/15 112/70    Wt Readings from Last 3 Encounters:  12/14/15 95 lb (43.092 kg)  09/02/15 94 lb (42.638 kg)  07/02/15 98 lb 5 oz (44.594 kg)    Physical Exam  Constitutional: She is oriented to person, place, and time. She appears well-developed and well-nourished. No distress.  HENT:  Head: Normocephalic and atraumatic.    Mouth/Throat: Oropharynx is clear and moist. No oropharyngeal exudate.  Eyes: Conjunctivae are normal. Right eye exhibits no discharge. Left eye exhibits no discharge. No scleral icterus.  Neck: Normal range of motion. Neck supple. No JVD present. No  tracheal deviation present. No thyromegaly present.  Cardiovascular: Normal rate, regular rhythm, normal heart sounds and intact distal pulses.  Exam reveals no gallop and no friction rub.   No murmur heard. Pulmonary/Chest: Effort normal and breath sounds normal. No stridor. No respiratory distress. She has no wheezes. She has no rales. She exhibits no tenderness.  Abdominal: Soft. Bowel sounds are normal. She exhibits no distension and no mass. There is no tenderness. There is no rebound and no guarding.    Musculoskeletal: Normal range of motion. She exhibits no edema or tenderness.  Lymphadenopathy:    She has no cervical adenopathy.  Neurological: She is oriented to person, place, and time.  Skin: Skin is warm and dry. Rash noted. No purpura noted. Rash is not macular, not papular, not maculopapular, not nodular, not pustular, not vesicular and not urticarial. She is not diaphoretic. There is erythema. No pallor.  Psychiatric: She has a normal mood and affect. Her behavior is normal. Judgment and thought content normal.  Vitals reviewed.   Lab Results  Component Value Date   WBC 8.6 07/02/2015   HGB 13.9 07/02/2015   HCT 42.5 07/02/2015   PLT 248.0 07/02/2015   GLUCOSE 98 07/02/2015   CHOL 222* 07/02/2015   TRIG 129.0 07/02/2015   HDL 102.50 07/02/2015   LDLDIRECT 146.8 06/01/2011   LDLCALC 94 07/02/2015   ALT 25 07/02/2015   AST 20 07/02/2015   NA 137 07/02/2015   K 4.9 07/02/2015   CL 102 07/02/2015   CREATININE 1.06 07/02/2015   BUN 16 07/02/2015   CO2 28 07/02/2015   TSH 1.40 06/02/2014   HGBA1C 5.6 07/02/2015    No results found.  Assessment & Plan:   Kaylee Li was seen today for osteoarthritis and rash.  Diagnoses and all orders for this visit:  Acute seborrheic dermatitis- her son who is with her today will make sure that she has a appointment with dermatology, for now I have asked him to restart using the kidney, is all cream to the face twice a day diligently for the next few months. She will continue to use desowen lotion as needed. -     ketoconazole (NIZORAL) 2 % cream; Apply 1 application topically 2 (two) times daily.  Primary osteoarthritis involving multiple joints- will continue Percocet as needed for pain.  Essential hypertension- her blood pressure is adequately well controlled   I am having Kaylee Li maintain her aspirin, acetaminophen, desonide, Cholecalciferol, cyanocobalamin, losartan, pravastatin, oxyCODONE-acetaminophen, and  ketoconazole.  Meds ordered this encounter  Medications  . ketoconazole (NIZORAL) 2 % cream    Sig: Apply 1 application topically 2 (two) times daily.    Dispense:  60 g    Refill:  11     Follow-up: Return in about 4 months (around 04/13/2016).  Kaylee Calico, MD

## 2015-12-14 NOTE — Progress Notes (Signed)
Pre visit review using our clinic review tool, if applicable. No additional management support is needed unless otherwise documented below in the visit note. 

## 2015-12-14 NOTE — Patient Instructions (Signed)
Seborrheic Dermatitis Seborrheic dermatitis involves pink or red skin with greasy, flaky scales. It usually occurs on the scalp, and it is often called dandruff. This condition may also affect the eyebrows, nose, ears, chest, and the bearded area of men's faces. It often occurs where skin has more oil (sebaceous) glands. It may come and go for no known reason, and it is often long-lasting (chronic). CAUSES The cause is not known. RISK FACTORS This condition is more like to develop in:  People who are stressed or tired.  People who have skin conditions, such as acne.  People who have certain conditions, such as:  HIV (human immunodeficiency virus).  AIDS (acquired immunodeficiency syndrome).  Parkinson disease.  An eating disorder.  Stroke.  Depression.  Epilepsy.  Alcoholism.  People who live in places that have extreme weather.  People who have a family history of seborrheic dermatitis.  People who use skin creams that are made with alcohol.  People who are 30-60 years old.  People who take certain medicines. SYMPTOMS Symptoms of this condition include:  Thick scales on the scalp.  Redness on the face or in the armpits.  Skin that is flaky. The flakes may be white or yellow.  Skin that seems oily or dry but is not helped with moisturizers.  Itching or burning in the affected areas. DIAGNOSIS This condition is diagnosed with a medical history and physical exam. A sample of your skin may be tested (skin biopsy). You may need to see a skin specialist (dermatologist). TREATMENT There is no cure for this condition, but treatment can help to manage the symptoms. Treatment may include:  Cortisone (steroid) ointments, creams, and lotions.  Over-the-counter or prescription shampoos. HOME CARE INSTRUCTIONS  Apply over-the-counter and prescription medicines only as told by your health care provider.  Keep all follow-up visits as told by your health care provider.  This is important.  Try to reduce your stress, such as with yoga or mediation. If you need help to reduce stress, ask your health care provider.  Shower or bathe as told by your health care provider.  Use any medicated shampoos as told by your health care provider. SEEK MEDICAL CARE IF:  Your symptoms do not improve with treatment.  Your symptoms get worse.  You have new symptoms.   This information is not intended to replace advice given to you by your health care provider. Make sure you discuss any questions you have with your health care provider.   Document Released: 12/12/2005 Document Revised: 09/02/2015 Document Reviewed: 04/29/2015 Elsevier Interactive Patient Education 2016 [employer]  

## 2015-12-22 DIAGNOSIS — L219 Seborrheic dermatitis, unspecified: Secondary | ICD-10-CM | POA: Diagnosis not present

## 2015-12-22 DIAGNOSIS — D225 Melanocytic nevi of trunk: Secondary | ICD-10-CM | POA: Diagnosis not present

## 2016-01-04 ENCOUNTER — Telehealth: Payer: Self-pay

## 2016-01-04 DIAGNOSIS — M150 Primary generalized (osteo)arthritis: Principal | ICD-10-CM

## 2016-01-04 DIAGNOSIS — M159 Polyosteoarthritis, unspecified: Secondary | ICD-10-CM

## 2016-01-04 MED ORDER — OXYCODONE-ACETAMINOPHEN 5-325 MG PO TABS: 1.0000 | ORAL_TABLET | ORAL | Status: DC | PRN

## 2016-01-04 NOTE — Telephone Encounter (Signed)
done

## 2016-01-04 NOTE — Telephone Encounter (Signed)
Pt lm on triage for rf rq of hydrocodone.  Pls advise.

## 2016-01-05 DIAGNOSIS — Z79891 Long term (current) use of opiate analgesic: Secondary | ICD-10-CM | POA: Diagnosis not present

## 2016-01-11 ENCOUNTER — Other Ambulatory Visit: Payer: Self-pay | Admitting: Internal Medicine

## 2016-01-11 ENCOUNTER — Encounter: Payer: Self-pay | Admitting: Internal Medicine

## 2016-02-04 ENCOUNTER — Telehealth: Payer: Self-pay

## 2016-02-04 NOTE — Telephone Encounter (Signed)
Pt's son advised and transferred to schedule OV with PCP to discuss

## 2016-02-04 NOTE — Telephone Encounter (Signed)
Her UDS was negative for oxycodone so I have discontinued this medication

## 2016-02-04 NOTE — Telephone Encounter (Signed)
Pt advised in detail but seems somewhat confused.  Pt stated that if her son had any question he will call back

## 2016-02-04 NOTE — Telephone Encounter (Signed)
Pt son called in explain to him about meds...  She wants to know if he is not going to her oxycodone then what is he going to give her for pain?

## 2016-02-04 NOTE — Telephone Encounter (Signed)
Caller requests refill of Oxycodone 5-325 mg. Last refilled 01/04/2016, please advise

## 2016-02-08 ENCOUNTER — Encounter: Payer: Self-pay | Admitting: Internal Medicine

## 2016-02-08 ENCOUNTER — Telehealth: Payer: Self-pay | Admitting: Geriatric Medicine

## 2016-02-08 ENCOUNTER — Ambulatory Visit (INDEPENDENT_AMBULATORY_CARE_PROVIDER_SITE_OTHER): Payer: Commercial Managed Care - HMO | Admitting: Internal Medicine

## 2016-02-08 ENCOUNTER — Telehealth: Payer: Self-pay

## 2016-02-08 VITALS — BP 140/90 | HR 98 | Temp 98.1°F | Resp 16 | Ht 66.0 in

## 2016-02-08 DIAGNOSIS — R739 Hyperglycemia, unspecified: Secondary | ICD-10-CM

## 2016-02-08 DIAGNOSIS — I10 Essential (primary) hypertension: Secondary | ICD-10-CM | POA: Diagnosis not present

## 2016-02-08 DIAGNOSIS — Z23 Encounter for immunization: Secondary | ICD-10-CM

## 2016-02-08 DIAGNOSIS — E785 Hyperlipidemia, unspecified: Secondary | ICD-10-CM | POA: Diagnosis not present

## 2016-02-08 DIAGNOSIS — E538 Deficiency of other specified B group vitamins: Secondary | ICD-10-CM

## 2016-02-08 DIAGNOSIS — M159 Polyosteoarthritis, unspecified: Secondary | ICD-10-CM

## 2016-02-08 DIAGNOSIS — M150 Primary generalized (osteo)arthritis: Secondary | ICD-10-CM

## 2016-02-08 DIAGNOSIS — M810 Age-related osteoporosis without current pathological fracture: Secondary | ICD-10-CM

## 2016-02-08 DIAGNOSIS — E2839 Other primary ovarian failure: Secondary | ICD-10-CM

## 2016-02-08 NOTE — Progress Notes (Signed)
Pre visit review using our clinic review tool, if applicable. No additional management support is needed unless otherwise documented below in the visit note. 

## 2016-02-08 NOTE — Telephone Encounter (Signed)
Patient came in for an appointment accompanied by her son. Dr. Ronnald Ramp requested to speak to the patient alone without her son. I went to pull patient back and when she was getting up her son told her not to answer anything that she didn't feel comfortable answering and not to answer anything that she didn't know the answer to.

## 2016-02-08 NOTE — Progress Notes (Signed)
Subjective:  Patient ID: Kaylee Li, female    DOB: 09-29-1930  Age: 82 y.o. MRN: [id number]  CC: Hyperlipidemia; Hypertension; and Osteoarthritis   HPI Kaylee Li presents for follow-up. Her son has been asking for Percocet refills. She was given this a while back for postherpetic neuralgia. Her last urine drug screen was negative for oxycodone despite the fact that we have been refilling Percocet on a monthly basis. I decided today to see her without her son. The patient and her son were informed in the waiting room that she would be seen today without her son and some of the staff overheard the son tell his mother not to answer any of my questions. The staff also told me that he became irritable and "belligerent" in the waiting room. They told me he refused to pay her co-pay. She was brought back to an exam room without him and he stayed in the waiting room. The only pain she mentioned today was that she occasionally has soreness in her shoulders for which she takes Tylenol. She did not mention any pain consistent with postherpetic neuralgia. She did not request a prescription for Percocet. She is clear and coherent today. She tells me that her son manages her medications and he puts them in a pillbox each day.  Outpatient Prescriptions Prior to Visit  Medication Sig Dispense Refill  . acetaminophen (TYLENOL) 500 MG tablet Take 1,000 mg by mouth 3 (three) times daily.     Marland Kitchen aspirin 81 MG tablet Take 81 mg by mouth daily.      . Cholecalciferol 2000 UNITS TABS Take 1 tablet (2,000 Units total) by mouth daily. 90 tablet 3  . cyanocobalamin 2000 MCG tablet Take 1 tablet (2,000 mcg total) by mouth daily. 90 tablet 3  . desonide (DESOWEN) 0.05 % lotion Apply topically 2 (two) times daily. 59 mL 11  . ketoconazole (NIZORAL) 2 % cream Apply 1 application topically 2 (two) times daily. 60 g 11  . losartan (COZAAR) 50 MG tablet Take 1 tablet (50 mg total) by mouth daily. 90 tablet 3  . pravastatin  (PRAVACHOL) 40 MG tablet Take 1 tablet (40 mg total) by mouth daily. 90 tablet 3   No facility-administered medications prior to visit.    ROS Review of Systems  Constitutional: Negative.   HENT: Negative.   Eyes: Negative.   Respiratory: Negative.   Cardiovascular: Negative.  Negative for chest pain, palpitations and leg swelling.  Gastrointestinal: Negative.  Negative for nausea, vomiting, abdominal pain, diarrhea, constipation and blood in stool.  Endocrine: Negative.   Genitourinary: Negative.   Musculoskeletal: Positive for arthralgias (soreness in both shoulders).  Skin: Negative.  Negative for color change and rash.  Allergic/Immunologic: Negative.   Neurological: Negative.  Negative for dizziness, weakness and headaches.  Hematological: Negative.  Negative for adenopathy. Does not bruise/bleed easily.  Psychiatric/Behavioral: Negative.  Negative for suicidal ideas, confusion, sleep disturbance, dysphoric mood and decreased concentration. The patient is not nervous/anxious.     Objective:  BP 140/90 mmHg  Pulse 98  Temp(Src) 98.1 F (36.7 C) (Oral)  Resp 16  Ht 5\' 6"  (1.676 m)  SpO2 94%  BP Readings from Last 3 Encounters:  02/08/16 140/90  12/14/15 140/92  09/02/15 133/88    Wt Readings from Last 3 Encounters:  12/14/15 95 lb (43.092 kg)  09/02/15 94 lb (42.638 kg)  07/02/15 98 lb 5 oz (44.594 kg)    Physical Exam  Constitutional: She is oriented to person, place, and  time. No distress.  HENT:  Head: Normocephalic and atraumatic.  Mouth/Throat: Oropharynx is clear and moist. No oropharyngeal exudate.  Eyes: Conjunctivae are normal. Right eye exhibits no discharge. Left eye exhibits no discharge. No scleral icterus.  Neck: Normal range of motion. Neck supple. No JVD present. No tracheal deviation present. No thyromegaly present.  Cardiovascular: Normal rate, regular rhythm, normal heart sounds and intact distal pulses.  Exam reveals no gallop and no friction  rub.   No murmur heard. Pulmonary/Chest: Effort normal and breath sounds normal. No stridor. No respiratory distress. She has no wheezes. She has no rales. She exhibits no tenderness.  Abdominal: Soft. Bowel sounds are normal. She exhibits no distension and no mass. There is no tenderness. There is no rebound and no guarding.  Musculoskeletal: Normal range of motion. She exhibits no edema or tenderness.  Lymphadenopathy:    She has no cervical adenopathy.  Neurological: She is oriented to person, place, and time.  Skin: Skin is warm and dry. No rash noted. She is not diaphoretic. No erythema. No pallor.  Vitals reviewed.   Lab Results  Component Value Date   WBC 8.6 07/02/2015   HGB 13.9 07/02/2015   HCT 42.5 07/02/2015   PLT 248.0 07/02/2015   GLUCOSE 98 07/02/2015   CHOL 222* 07/02/2015   TRIG 129.0 07/02/2015   HDL 102.50 07/02/2015   LDLDIRECT 146.8 06/01/2011   LDLCALC 94 07/02/2015   ALT 25 07/02/2015   AST 20 07/02/2015   NA 137 07/02/2015   K 4.9 07/02/2015   CL 102 07/02/2015   CREATININE 1.06 07/02/2015   BUN 16 07/02/2015   CO2 28 07/02/2015   TSH 1.40 06/02/2014   HGBA1C 5.6 07/02/2015    No results found.  Assessment & Plan:   Kaylee Li was seen today for hyperlipidemia, hypertension and osteoarthritis.  Diagnoses and all orders for this visit:  Need for vaccination with 13-polyvalent pneumococcal conjugate vaccine -     Pneumococcal conjugate vaccine 13-valent  Essential hypertension- her blood pressure is adequately well controlled, I will monitor her lites and renal function -     Comprehensive metabolic panel; Future -     CBC with Differential/Platelet; Future  B12 deficiency  Hyperlipidemia with target LDL less than 130- she is doing well on the statin, I will monitor her labs today for muscle and hepatic toxicity. -     Lipid panel; Future -     CK; Future -     TSH; Future -     Comprehensive metabolic panel; Future  Hyperglycemia- she is  prediabetic, will continue to monitor her A1c, no medications are needed at this time. -     Hemoglobin A1c; Future  Estrogen deficiency -     DG Bone Density; Future  Osteoporosis -     VITAMIN D 25 Hydroxy (Vit-D Deficiency, Fractures); Future -     DG Bone Density; Future  Primary osteoarthritis involving multiple joints- I did not give her a refill of Percocet today, I am concerned that her son may be diverting her Percocet either for his own use or for monetary gain, she tells me that she is getting symptom relief with Tylenol and will continue that.   I am having Kaylee Li maintain her aspirin, acetaminophen, desonide, Cholecalciferol, cyanocobalamin, losartan, pravastatin, and ketoconazole.  No orders of the defined types were placed in this encounter.     Follow-up: Return in about 6 months (around 08/07/2016).  Scarlette Calico, MD

## 2016-02-08 NOTE — Telephone Encounter (Signed)
Patients son was upset that he did not go back with patient during office visit, per dr Ronnald Ramp, he wanted to see the patient alone first---during the visit, patient explained that pain was not an issue at this visit, patients uds has shown negative for percocet and dr Ronnald Ramp feels that patient is not taking percocet because of these results, and that son is requesting percocet to either take or sale---Julie/mgr and myself explained to son that nothing for pain will be prescribed today per dr Wessling---and patient has been asked to follow up with office in 6 months---per dr Ronnald Ramp, if patient's son did not comply with this, dr Ronnald Ramp would like police called and have son escorted out because he is disrupting this office and our other patients---patient's son did comply with leaving without escort today, no other problems were encountered at this time

## 2016-02-08 NOTE — Patient Instructions (Signed)
Hypertension Hypertension, commonly called high blood pressure, is when the force of blood pumping through your arteries is too strong. Your arteries are the blood vessels that carry blood from your heart throughout your body. A blood pressure reading consists of a higher number over a lower number, such as 110/72. The higher number (systolic) is the pressure inside your arteries when your heart pumps. The lower number (diastolic) is the pressure inside your arteries when your heart relaxes. Ideally you want your blood pressure below 120/80. Hypertension forces your heart to work harder to pump blood. Your arteries may become narrow or stiff. Having untreated or uncontrolled hypertension can cause heart attack, stroke, kidney disease, and other problems. RISK FACTORS Some risk factors for high blood pressure are controllable. Others are not.  Risk factors you cannot control include:   Race. You may be at higher risk if you are African American.  Age. Risk increases with age.  Gender. Men are at higher risk than women before age 45 years. After age 65, women are at higher risk than men. Risk factors you can control include:  Not getting enough exercise or physical activity.  Being overweight.  Getting too much fat, sugar, calories, or salt in your diet.  Drinking too much alcohol. SIGNS AND SYMPTOMS Hypertension does not usually cause signs or symptoms. Extremely high blood pressure (hypertensive crisis) may cause headache, anxiety, shortness of breath, and nosebleed. DIAGNOSIS To check if you have hypertension, your health care provider will measure your blood pressure while you are seated, with your arm held at the level of your heart. It should be measured at least twice using the same arm. Certain conditions can cause a difference in blood pressure between your right and left arms. A blood pressure reading that is higher than normal on one occasion does not mean that you need treatment. If  it is not clear whether you have high blood pressure, you may be asked to return on a different day to have your blood pressure checked again. Or, you may be asked to monitor your blood pressure at home for 1 or more weeks. TREATMENT Treating high blood pressure includes making lifestyle changes and possibly taking medicine. Living a healthy lifestyle can help lower high blood pressure. You may need to change some of your habits. Lifestyle changes may include:  Following the DASH diet. This diet is high in fruits, vegetables, and whole grains. It is low in salt, red meat, and added sugars.  Keep your sodium intake below 2,300 mg per day.  Getting at least 30-45 minutes of aerobic exercise at least 4 times per week.  Losing weight if necessary.  Not smoking.  Limiting alcoholic beverages.  Learning ways to reduce stress. Your health care provider may prescribe medicine if lifestyle changes are not enough to get your blood pressure under control, and if one of the following is true:  You are 18-59 years of age and your systolic blood pressure is above 140.  You are 60 years of age or older, and your systolic blood pressure is above 150.  Your diastolic blood pressure is above 90.  You have diabetes, and your systolic blood pressure is over 140 or your diastolic blood pressure is over 90.  You have kidney disease and your blood pressure is above 140/90.  You have heart disease and your blood pressure is above 140/90. Your personal target blood pressure may vary depending on your medical conditions, your age, and other factors. HOME CARE INSTRUCTIONS    Have your blood pressure rechecked as directed by your health care provider.   Take medicines only as directed by your health care provider. Follow the directions carefully. Blood pressure medicines must be taken as prescribed. The medicine does not work as well when you skip doses. Skipping doses also puts you at risk for  problems.  Do not smoke.   Monitor your blood pressure at home as directed by your health care provider. SEEK MEDICAL CARE IF:   You think you are having a reaction to medicines taken.  You have recurrent headaches or feel dizzy.  You have swelling in your ankles.  You have trouble with your vision. SEEK IMMEDIATE MEDICAL CARE IF:  You develop a severe headache or confusion.  You have unusual weakness, numbness, or feel faint.  You have severe chest or abdominal pain.  You vomit repeatedly.  You have trouble breathing. MAKE SURE YOU:   Understand these instructions.  Will watch your condition.  Will get help right away if you are not doing well or get worse.   This information is not intended to replace advice given to you by your health care provider. Make sure you discuss any questions you have with your health care provider.   Document Released: 12/12/2005 Document Revised: 04/28/2015 Document Reviewed: 10/04/2013 Elsevier Interactive Patient Education 2016 [employer]  

## 2016-02-10 ENCOUNTER — Telehealth: Payer: Self-pay | Admitting: Internal Medicine

## 2016-02-10 NOTE — Telephone Encounter (Signed)
error 

## 2016-03-21 ENCOUNTER — Ambulatory Visit (INDEPENDENT_AMBULATORY_CARE_PROVIDER_SITE_OTHER): Payer: Commercial Managed Care - HMO | Admitting: Internal Medicine

## 2016-03-21 ENCOUNTER — Encounter: Payer: Self-pay | Admitting: Internal Medicine

## 2016-03-21 VITALS — BP 138/80 | HR 95 | Temp 98.4°F | Resp 16 | Ht 66.0 in | Wt 97.0 lb

## 2016-03-21 DIAGNOSIS — M159 Polyosteoarthritis, unspecified: Secondary | ICD-10-CM

## 2016-03-21 DIAGNOSIS — L219 Seborrheic dermatitis, unspecified: Secondary | ICD-10-CM

## 2016-03-21 DIAGNOSIS — I10 Essential (primary) hypertension: Secondary | ICD-10-CM

## 2016-03-21 DIAGNOSIS — M150 Primary generalized (osteo)arthritis: Secondary | ICD-10-CM | POA: Diagnosis not present

## 2016-03-21 MED ORDER — FENTANYL 12 MCG/HR TD PT72: 12.5000 ug | MEDICATED_PATCH | TRANSDERMAL | Status: DC

## 2016-03-21 NOTE — Progress Notes (Signed)
Subjective:  Patient ID: Kaylee Li, female    DOB: [birth year]  Age: 82 y.o. MRN: [id number]  CC: Osteoarthritis   HPI [name] presents for treatment of chronic joint pain (shoulders, knees). She has tried tylenol and aspirin with very minimal relief from her pain. She denies redness or swelling in her joints. Her BP has been well controlled. The rash on her face has improved significantly with the use of desowen and ketoconazole.  Outpatient Prescriptions Prior to Visit  Medication Sig Dispense Refill  . aspirin 81 MG tablet Take 81 mg by mouth daily.      Marland Kitchen desonide (DESOWEN) 0.05 % lotion Apply topically 2 (two) times daily. 59 mL 11  . ketoconazole (NIZORAL) 2 % cream Apply 1 application topically 2 (two) times daily. 60 g 11  . losartan (COZAAR) 50 MG tablet Take 1 tablet (50 mg total) by mouth daily. 90 tablet 3  . pravastatin (PRAVACHOL) 40 MG tablet Take 1 tablet (40 mg total) by mouth daily. 90 tablet 3  . acetaminophen (TYLENOL) 500 MG tablet Take 1,000 mg by mouth 3 (three) times daily. Reported on 03/21/2016    . Cholecalciferol 2000 UNITS TABS Take 1 tablet (2,000 Units total) by mouth daily. (Patient not taking: Reported on 03/21/2016) 90 tablet 3  . cyanocobalamin 2000 MCG tablet Take 1 tablet (2,000 mcg total) by mouth daily. (Patient not taking: Reported on 03/21/2016) 90 tablet 3   No facility-administered medications prior to visit.    ROS Review of Systems  Constitutional: Negative.  Negative for fever, chills, diaphoresis, appetite change and fatigue.  HENT: Negative.   Eyes: Negative.   Respiratory: Negative.  Negative for cough, choking, chest tightness, shortness of breath and stridor.   Cardiovascular: Negative.  Negative for chest pain, palpitations and leg swelling.  Gastrointestinal: Negative.  Negative for nausea, vomiting, abdominal pain, diarrhea, constipation and blood in stool.  Endocrine: Negative.   Genitourinary: Negative.  Negative for dysuria,  urgency, hematuria, flank pain, decreased urine volume, enuresis and difficulty urinating.  Musculoskeletal: Positive for arthralgias. Negative for myalgias, back pain and neck pain.  Skin: Negative.  Negative for color change.  Allergic/Immunologic: Negative.   Neurological: Negative.  Negative for dizziness, tremors, weakness, light-headedness, numbness and headaches.  Hematological: Negative.  Negative for adenopathy. Does not bruise/bleed easily.  Psychiatric/Behavioral: Negative.     Objective:  BP 138/80 mmHg  Pulse 95  Temp(Src) 98.4 F (36.9 C) (Oral)  Resp 16  Ht 5\' 6"  (1.676 m)  Wt 97 lb (43.999 kg)  BMI 15.66 kg/m2  SpO2 94%  BP Readings from Last 3 Encounters:  03/21/16 138/80  02/08/16 140/90  12/14/15 140/92    Wt Readings from Last 3 Encounters:  03/21/16 97 lb (43.999 kg)  12/14/15 95 lb (43.092 kg)  09/02/15 94 lb (42.638 kg)    Physical Exam  Constitutional: She is oriented to person, place, and time. She appears well-developed and well-nourished. No distress.  HENT:  Head: Normocephalic and atraumatic.  Mouth/Throat: Oropharynx is clear and moist. No oropharyngeal exudate.  Eyes: Conjunctivae are normal. Right eye exhibits no discharge. Left eye exhibits no discharge. No scleral icterus.  Neck: Normal range of motion. Neck supple. No JVD present. No tracheal deviation present. No thyromegaly present.  Cardiovascular: Normal rate, regular rhythm, normal heart sounds and intact distal pulses.  Exam reveals no gallop and no friction rub.   No murmur heard. Pulmonary/Chest: Effort normal and breath sounds normal. No stridor. No respiratory distress. She  has no wheezes. She has no rales. She exhibits no tenderness.  Abdominal: Soft. Bowel sounds are normal. She exhibits no distension and no mass. There is no tenderness. There is no rebound and no guarding.  Musculoskeletal: Normal range of motion. She exhibits no edema or tenderness.  Lymphadenopathy:    She  has no cervical adenopathy.  Neurological: She is oriented to person, place, and time.  Skin: Skin is warm and dry. No rash noted. She is not diaphoretic. No erythema. No pallor.  Psychiatric: She has a normal mood and affect. Her behavior is normal. Judgment and thought content normal.  Vitals reviewed.   Lab Results  Component Value Date   WBC 8.6 07/02/2015   HGB 13.9 07/02/2015   HCT 42.5 07/02/2015   PLT 248.0 07/02/2015   GLUCOSE 98 07/02/2015   CHOL 222* 07/02/2015   TRIG 129.0 07/02/2015   HDL 102.50 07/02/2015   LDLDIRECT 146.8 06/01/2011   LDLCALC 94 07/02/2015   ALT 25 07/02/2015   AST 20 07/02/2015   NA 137 07/02/2015   K 4.9 07/02/2015   CL 102 07/02/2015   CREATININE 1.06 07/02/2015   BUN 16 07/02/2015   CO2 28 07/02/2015   TSH 1.40 06/02/2014   HGBA1C 5.6 07/02/2015    No results found.  Assessment & Plan:   Reona was seen today for osteoarthritis.  Diagnoses and all orders for this visit:  Primary osteoarthritis involving multiple joints- will treat the pain with fentanyl -     Discontinue: fentaNYL (DURAGESIC - DOSED MCG/HR) 12 MCG/HR; Place 1 patch (12.5 mcg total) onto the skin every 3 (three) days. -     Discontinue: fentaNYL (DURAGESIC - DOSED MCG/HR) 12 MCG/HR; Place 1 patch (12.5 mcg total) onto the skin every 3 (three) days. -     Discontinue: fentaNYL (DURAGESIC - DOSED MCG/HR) 12 MCG/HR; Place 1 patch (12.5 mcg total) onto the skin every 3 (three) days. -     fentaNYL (DURAGESIC - DOSED MCG/HR) 12 MCG/HR; Place 1 patch (12.5 mcg total) onto the skin every 3 (three) days.  Essential hypertension- her BP is well controlled  Acute seborrheic dermatitis- marked improvement noted   I am having Ms. Smoot maintain her aspirin, acetaminophen, desonide, Cholecalciferol, cyanocobalamin, losartan, pravastatin, ketoconazole, and fentaNYL.  Meds ordered this encounter  Medications  . DISCONTD: fentaNYL (DURAGESIC - DOSED MCG/HR) 12 MCG/HR    Sig:  Place 1 patch (12.5 mcg total) onto the skin every 3 (three) days.    Dispense:  10 patch    Refill:  0    FILL ON OR AFTER 03/21/16  . DISCONTD: fentaNYL (DURAGESIC - DOSED MCG/HR) 12 MCG/HR    Sig: Place 1 patch (12.5 mcg total) onto the skin every 3 (three) days.    Dispense:  10 patch    Refill:  0    FILL ON OR AFTER 04/21/16  . DISCONTD: fentaNYL (DURAGESIC - DOSED MCG/HR) 12 MCG/HR    Sig: Place 1 patch (12.5 mcg total) onto the skin every 3 (three) days.    Dispense:  10 patch    Refill:  0    FILL ON OR AFTER 05/21/16  . fentaNYL (DURAGESIC - DOSED MCG/HR) 12 MCG/HR    Sig: Place 1 patch (12.5 mcg total) onto the skin every 3 (three) days.    Dispense:  10 patch    Refill:  0    FILL ON OR AFTER 06/21/16     Follow-up: Return in about 4 months (  around 07/21/2016).  Scarlette Calico, MD

## 2016-03-21 NOTE — Progress Notes (Signed)
Pre visit review using our clinic review tool, if applicable. No additional management support is needed unless otherwise documented below in the visit note. 

## 2016-03-21 NOTE — Patient Instructions (Signed)

## 2016-04-13 ENCOUNTER — Telehealth: Payer: Self-pay | Admitting: Internal Medicine

## 2016-04-13 NOTE — Telephone Encounter (Signed)
Kaylee Li [name] called in stating that mother could not get pain medication refilled because mother came in for urine test and patient had not been taking medication for five days.  States patient is in a lot of pain and pain patches cost too much money.  Please follow up with patient in regards.

## 2016-07-04 ENCOUNTER — Telehealth: Payer: Self-pay

## 2016-07-04 NOTE — Telephone Encounter (Signed)
Tim (pt son) lm on triage to call back about pt medications.  Contacted Tim and he stated that he wanted to know if another rx (pill or patch) could be rxed.   Explain that we needed an updated DPR.  Class 2 and 3 medications are recommended for the older adult.  Tim asked about the Tox Screening that was negative and I explained they are random and that PCP would change to a Class 2 or 3 due to the results based on history of fills.  Informed Tim he could contact pt rx insurance coverage company and ask if there are any transdermal analgesics covered under pt plan.

## 2016-09-29 ENCOUNTER — Other Ambulatory Visit: Payer: Self-pay | Admitting: Internal Medicine

## 2016-09-29 DIAGNOSIS — E785 Hyperlipidemia, unspecified: Secondary | ICD-10-CM

## 2016-09-29 DIAGNOSIS — I10 Essential (primary) hypertension: Secondary | ICD-10-CM

## 2016-10-10 DIAGNOSIS — L218 Other seborrheic dermatitis: Secondary | ICD-10-CM | POA: Diagnosis not present

## 2017-12-18 ENCOUNTER — Encounter: Payer: Self-pay | Admitting: Family

## 2017-12-18 ENCOUNTER — Other Ambulatory Visit (INDEPENDENT_AMBULATORY_CARE_PROVIDER_SITE_OTHER): Payer: Medicare HMO

## 2017-12-18 ENCOUNTER — Ambulatory Visit (INDEPENDENT_AMBULATORY_CARE_PROVIDER_SITE_OTHER)
Admission: RE | Admit: 2017-12-18 | Discharge: 2017-12-18 | Disposition: A | Discharge status: Home / Self Care | Payer: Medicare HMO | Source: Ambulatory Visit | Attending: Family | Admitting: Family

## 2017-12-18 ENCOUNTER — Ambulatory Visit: Payer: Medicare HMO | Admitting: Family

## 2017-12-18 VITALS — BP 142/90 | HR 102 | Ht 66.0 in | Wt 100.0 lb

## 2017-12-18 DIAGNOSIS — E782 Mixed hyperlipidemia: Secondary | ICD-10-CM | POA: Diagnosis not present

## 2017-12-18 DIAGNOSIS — G8929 Other chronic pain: Secondary | ICD-10-CM

## 2017-12-18 DIAGNOSIS — I10 Essential (primary) hypertension: Secondary | ICD-10-CM

## 2017-12-18 DIAGNOSIS — M545 Low back pain, unspecified: Secondary | ICD-10-CM

## 2017-12-18 DIAGNOSIS — M48061 Spinal stenosis, lumbar region without neurogenic claudication: Secondary | ICD-10-CM | POA: Diagnosis not present

## 2017-12-18 LAB — LIPID PANEL
Cholesterol: 223 mg/dL — ABNORMAL HIGH (ref 0–200)
HDL: 93.1 mg/dL (ref >39.00)
NONHDL: 129.85
Total CHOL/HDL Ratio: 2
Triglycerides: 228 mg/dL — ABNORMAL HIGH (ref 0.0–149.0)
VLDL: 45.6 mg/dL — ABNORMAL HIGH (ref 0.0–40.0)

## 2017-12-18 LAB — COMPREHENSIVE METABOLIC PANEL
ALK PHOS: 74 U/L (ref 39–117)
ALT: 13 U/L (ref 0–35)
AST: 19 U/L (ref 0–37)
Albumin: 3.9 g/dL (ref 3.5–5.2)
BILIRUBIN TOTAL: 0.4 mg/dL (ref 0.2–1.2)
BUN: 21 mg/dL (ref 6–23)
CO2: 28 mEq/L (ref 19–32)
Calcium: 9 mg/dL (ref 8.4–10.5)
Chloride: 104 mEq/L (ref 96–112)
Creatinine, Ser: 1 mg/dL (ref 0.40–1.20)
GFR: 55.91 mL/min — ABNORMAL LOW (ref >60.00)
GLUCOSE: 97 mg/dL (ref 70–99)
Potassium: 4.5 mEq/L (ref 3.5–5.1)
SODIUM: 138 meq/L (ref 135–145)
TOTAL PROTEIN: 6.8 g/dL (ref 6.0–8.3)

## 2017-12-18 LAB — CBC
HCT: 43.4 % (ref 36.0–46.0)
Hemoglobin: 13.8 g/dL (ref 12.0–15.0)
MCHC: 31.9 g/dL (ref 30.0–36.0)
MCV: 90.6 fl (ref 78.0–100.0)
Platelets: 369 10*3/uL (ref 150.0–400.0)
RBC: 4.79 Mil/uL (ref 3.87–5.11)
RDW: 14.4 % (ref 11.5–15.5)
WBC: 8 10*3/uL (ref 4.0–10.5)

## 2017-12-18 LAB — LDL CHOLESTEROL, DIRECT: Direct LDL: 112 mg/dL

## 2017-12-18 MED ORDER — MELOXICAM 7.5 MG PO TABS: 7.5000 mg | ORAL_TABLET | Freq: Every day | ORAL | 0 refills | Status: DC

## 2017-12-18 NOTE — Progress Notes (Signed)
Kaylee Li is a 83 y.o. female with the following history as recorded in [employer]:  Patient Active Problem List   Diagnosis Date Noted  . Estrogen deficiency 02/08/2016  . Hyperglycemia 06/02/2014  . Acute seborrheic dermatitis 06/02/2014  . Routine general medical examination at a health care facility 06/02/2014  . Vitamin D deficiency 06/02/2014  . B12 deficiency 06/02/2014  . Other screening mammogram 01/12/2012  . Tobacco abuse 01/12/2012  . COPD 10/29/2008  . Hyperlipidemia with target LDL less than 130 10/31/2007  . Essential hypertension 10/31/2007  . DJD (degenerative joint disease), multiple sites 10/31/2007  . Osteoporosis 10/31/2007    Current Outpatient Medications  Medication Sig Dispense Refill  . acetaminophen (TYLENOL) 500 MG tablet Take 1,000 mg by mouth 3 (three) times daily. Reported on 03/21/2016    . aspirin 81 MG tablet Take 81 mg by mouth daily.      Marland Kitchen desonide (DESOWEN) 0.05 % lotion Apply topically 2 (two) times daily. 59 mL 11  . ketoconazole (NIZORAL) 2 % cream Apply 1 application topically 2 (two) times daily. 60 g 11  . losartan (COZAAR) 50 MG tablet TAKE 1 TABLET (50 MG TOTAL) BY MOUTH DAILY. (Patient not taking: Reported on 12/18/2017) 30 tablet 2  . meloxicam (MOBIC) 7.5 MG tablet Take 1 tablet (7.5 mg total) by mouth daily. 30 tablet 0  . pravastatin (PRAVACHOL) 40 MG tablet TAKE 1 TABLET (40 MG TOTAL) BY MOUTH DAILY. (Patient not taking: Reported on 12/18/2017) 30 tablet 11   No current facility-administered medications for this visit.     Allergies: Patient has no known allergies.  Past Medical History:  Diagnosis Date  . Anemia   . Arthritis   . COPD (chronic obstructive pulmonary disease) ([place])   . Emphysema of lung ([place])   . Hyperlipidemia   . Hypertension     Past Surgical History:  Procedure Laterality Date  . ABDOMINAL HYSTERECTOMY      Family History  Problem Relation Age of Onset  . Cancer Neg Hx   . Hypertension Neg Hx   .  Heart disease Neg Hx     Social History   Tobacco Use  . Smoking status: Current Every Day Smoker    Packs/day: 1.00    Years: 60.00    Pack years: 60.00    Types: Cigarettes  . Smokeless tobacco: Never Used  Substance Use Topics  . Alcohol use: Yes    Alcohol/week: 1.8 oz    Types: 3 Glasses of wine per week    Comment: every night    Subjective:  Patient presents with concerns for chronic low back pain/ bilateral leg pain; notes the pain has been problematic for the past 6-7 months; describes the pain as being localized in her low back and "running" into the backs of both of her legs; has not been seen here since 02/2016; her son accompanies her today; he notes his brother is the primary caregiver but that his brother could not come to the appointment today; does know that his mom is not taking her blood pressure or cholesterol medication everyday; states that his mom is only taking Tylenol for pain.   Objective:  Vitals:   12/18/17 0833  BP: (!) 142/90  Pulse: (!) 102  SpO2: 93%  Weight: 100 lb (45.4 kg)  Height: 5\' 6"  (1.676 m)    General: Well developed, well nourished, in no acute distress  Skin : Warm and dry.  Head: Normocephalic and atraumatic  Musculoskeletal: No  deformities; no active joint inflammation  Extremities: No edema, cyanosis, clubbing  Vessels: Symmetric bilaterally  Neurologic: Alert and oriented; speech intact; face symmetrical; moves all extremities well; CNII-XII intact without focal deficit  Assessment:  1. Chronic midline low back pain without sciatica   2. Essential hypertension   3. Mixed hyperlipidemia     Plan:  1. Update X-ray; suspect arthritis; will give trial of Mobic 7.5 mg daily; explained to son and patient to follow-up with her PCP to discuss other options for pain management. 2. Check CBC, CMP today; encouraged to take medication daily; follow-up in 2 weeks. 3. Check lipid panel today; take medication daily as prescribed.   Return  in about 2 weeks (around 01/01/2018) for with Dr. Ronnald Ramp.  Orders Placed This Encounter  Procedures  . DG Lumbar Spine Complete    Standing Status:   Future    Number of Occurrences:   1    Standing Expiration Date:   02/18/2019    Order Specific Question:   Reason for Exam (SYMPTOM  OR DIAGNOSIS REQUIRED)    Answer:   chronic low back pain    Order Specific Question:   Preferred imaging location?    Answer:   [facility]    Order Specific Question:   Radiology Contrast Protocol - do NOT remove file path    Answer:   [email or web address]  . CBC    Standing Status:   Future    Number of Occurrences:   1    Standing Expiration Date:   12/18/2018  . Comprehensive metabolic panel    Standing Status:   Future    Number of Occurrences:   1    Standing Expiration Date:   12/18/2018  . Lipid panel    Standing Status:   Future    Number of Occurrences:   1    Standing Expiration Date:   12/18/2018    Requested Prescriptions   Signed Prescriptions Disp Refills  . meloxicam (MOBIC) 7.5 MG tablet 30 tablet 0    Sig: Take 1 tablet (7.5 mg total) by mouth daily.

## 2018-01-02 ENCOUNTER — Ambulatory Visit: Payer: Self-pay | Admitting: Internal Medicine

## 2018-01-24 ENCOUNTER — Ambulatory Visit: Payer: Self-pay | Admitting: Internal Medicine

## 2018-01-30 ENCOUNTER — Ambulatory Visit: Payer: Self-pay | Admitting: Internal Medicine

## 2018-01-30 DIAGNOSIS — Z0289 Encounter for other administrative examinations: Secondary | ICD-10-CM

## 2018-02-15 ENCOUNTER — Ambulatory Visit: Payer: Self-pay | Admitting: Internal Medicine

## 2018-02-27 ENCOUNTER — Ambulatory Visit (INDEPENDENT_AMBULATORY_CARE_PROVIDER_SITE_OTHER): Payer: Medicare HMO | Admitting: Internal Medicine

## 2018-02-27 ENCOUNTER — Encounter: Payer: Self-pay | Admitting: Internal Medicine

## 2018-02-27 VITALS — BP 160/100 | HR 91 | Temp 98.4°F | Ht 66.0 in | Wt 99.0 lb

## 2018-02-27 DIAGNOSIS — M150 Primary generalized (osteo)arthritis: Secondary | ICD-10-CM

## 2018-02-27 DIAGNOSIS — I10 Essential (primary) hypertension: Secondary | ICD-10-CM

## 2018-02-27 DIAGNOSIS — Z23 Encounter for immunization: Secondary | ICD-10-CM | POA: Diagnosis not present

## 2018-02-27 DIAGNOSIS — M159 Polyosteoarthritis, unspecified: Secondary | ICD-10-CM

## 2018-02-27 DIAGNOSIS — L219 Seborrheic dermatitis, unspecified: Secondary | ICD-10-CM | POA: Diagnosis not present

## 2018-02-27 MED ORDER — DESONIDE 0.05 % EX LOTN: TOPICAL_LOTION | Freq: Two times a day (BID) | CUTANEOUS | 5 refills | Status: AC

## 2018-02-27 MED ORDER — KETOCONAZOLE 2 % EX CREA: 1.0000 "application " | TOPICAL_CREAM | Freq: Two times a day (BID) | CUTANEOUS | 5 refills | Status: AC

## 2018-02-27 MED ORDER — MELOXICAM 7.5 MG PO TABS: 7.5000 mg | ORAL_TABLET | Freq: Every day | ORAL | 1 refills | Status: DC

## 2018-02-27 MED ORDER — LOSARTAN POTASSIUM 50 MG PO TABS: 50.0000 mg | ORAL_TABLET | Freq: Every day | ORAL | 1 refills | Status: DC

## 2018-02-27 NOTE — Patient Instructions (Signed)

## 2018-02-27 NOTE — Progress Notes (Signed)
Subjective:  Patient ID: Kaylee Li, female    DOB: 26-Aug-1930  Age: 84 y.o. MRN: [id number]  CC: Rash; Hypertension; and Osteoarthritis   HPI Kaylee Li presents for f/up - She is with 1 of her sons today.  This is not the son that takes care of her.  The son that takes care of her has not been doing well and therefore she has not been receiving her medications.  She complains of recurrent rash on her face.  She complains of chronic pain in her right hip and thigh.  She denies any recent trauma or injury.  Meloxicam has been prescribed but she does not think she is receiving it.  She also complains that her blood pressure has not been well controlled.  The son is with her today does not think she is receiving any of her usual medications.  Outpatient Medications Prior to Visit  Medication Sig Dispense Refill  . acetaminophen (TYLENOL) 500 MG tablet Take 1,000 mg by mouth 3 (three) times daily. Reported on 03/21/2016    . aspirin 81 MG tablet Take 81 mg by mouth daily.      Marland Kitchen losartan (COZAAR) 50 MG tablet TAKE 1 TABLET (50 MG TOTAL) BY MOUTH DAILY. 30 tablet 2  . meloxicam (MOBIC) 7.5 MG tablet Take 1 tablet (7.5 mg total) by mouth daily. 30 tablet 0  . pravastatin (PRAVACHOL) 40 MG tablet TAKE 1 TABLET (40 MG TOTAL) BY MOUTH DAILY. 30 tablet 11  . desonide (DESOWEN) 0.05 % lotion Apply topically 2 (two) times daily. 59 mL 11  . ketoconazole (NIZORAL) 2 % cream Apply 1 application topically 2 (two) times daily. 60 g 11   No facility-administered medications prior to visit.     ROS Review of Systems  Constitutional: Negative.  Negative for appetite change, diaphoresis and fatigue.  HENT: Negative.  Negative for sore throat and trouble swallowing.   Eyes: Negative for visual disturbance.  Respiratory: Negative for cough, chest tightness, shortness of breath and wheezing.   Cardiovascular: Negative for chest pain, palpitations and leg swelling.  Gastrointestinal: Negative for  abdominal pain, constipation, diarrhea and nausea.  Endocrine: Negative.   Genitourinary: Negative.  Negative for difficulty urinating.  Musculoskeletal: Positive for arthralgias. Negative for back pain, myalgias and neck pain.  Skin: Positive for rash. Negative for color change and pallor.  Allergic/Immunologic: Negative.   Neurological: Negative.  Negative for dizziness, weakness, light-headedness and headaches.  Hematological: Negative for adenopathy. Does not bruise/bleed easily.  Psychiatric/Behavioral: Negative.     Objective:  BP (!) 160/100 (BP Location: Left Arm, Patient Position: Sitting, Cuff Size: Normal)   Pulse 91   Temp 98.4 F (36.9 C) (Oral)   Ht 5\' 6"  (1.676 m)   Wt 99 lb (44.9 kg)   SpO2 93%   BMI 15.98 kg/m   BP Readings from Last 3 Encounters:  02/27/18 (!) 160/100  12/18/17 (!) 142/90  03/21/16 138/80    Wt Readings from Last 3 Encounters:  02/27/18 99 lb (44.9 kg)  12/18/17 100 lb (45.4 kg)  03/21/16 97 lb (44 kg)    Physical Exam  Constitutional: She is oriented to person, place, and time. No distress.  HENT:  Head:    Mouth/Throat: Oropharynx is clear and moist. No oropharyngeal exudate.  Eyes: Conjunctivae are normal. Left eye exhibits no discharge. No scleral icterus.  Neck: Normal range of motion. No JVD present. No thyromegaly present.  Cardiovascular: Normal rate, regular rhythm and normal heart sounds.  Exam reveals no gallop.  No murmur heard. Pulmonary/Chest: Effort normal and breath sounds normal. No respiratory distress. She has no wheezes. She has no rales.  Abdominal: Soft. Bowel sounds are normal. She exhibits no distension and no mass. There is no tenderness. There is no guarding.  Musculoskeletal: Normal range of motion. She exhibits no edema, tenderness or deformity.       Right hip: Normal. She exhibits normal range of motion, no tenderness, no bony tenderness and no swelling.       Right upper leg: Normal. She exhibits no  tenderness, no bony tenderness, no swelling, no edema and no deformity.  Lymphadenopathy:    She has no cervical adenopathy.  Neurological: She is alert and oriented to person, place, and time.  Skin: Skin is warm and dry. No rash noted. She is not diaphoretic. No erythema.  Vitals reviewed.   Lab Results  Component Value Date   WBC 8.0 12/18/2017   HGB 13.8 12/18/2017   HCT 43.4 12/18/2017   PLT 369.0 12/18/2017   GLUCOSE 97 12/18/2017   CHOL 223 (H) 12/18/2017   TRIG 228.0 (H) 12/18/2017   HDL 93.10 12/18/2017   LDLDIRECT 112.0 12/18/2017   LDLCALC 94 07/02/2015   ALT 13 12/18/2017   AST 19 12/18/2017   NA 138 12/18/2017   K 4.5 12/18/2017   CL 104 12/18/2017   CREATININE 1.00 12/18/2017   BUN 21 12/18/2017   CO2 28 12/18/2017   TSH 1.40 06/02/2014   HGBA1C 5.6 07/02/2015    Dg Lumbar Spine Complete  Result Date: 12/18/2017 CLINICAL DATA:  Lower back AND bilateral leg pain x intermittent x 6-7 mos, NKI. EXAM: LUMBAR SPINE - COMPLETE 4+ VIEW COMPARISON:  None. FINDINGS: Negative for fracture. Moderate narrowing of the L1-2 and L4-5 interspaces. Mild narrowing L2-3, L3-4. Grade 2 anterolisthesis L4-5. No convincing pars defect identified. Extensive aortoiliac arterial calcifications without suggestion of aneurysm. IMPRESSION: 1. Negative for fracture or other acute bone abnormality. 2. Multilevel degenerative disc disease with grade 2 anterolisthesis L4-5. 3.  Aortic Atherosclerosis (ICD10-170.0) Electronically Signed   By: Lucrezia Europe M.D.   On: 12/18/2017 09:24    Assessment & Plan:   Kaylee Li was seen today for rash, hypertension and osteoarthritis.  Diagnoses and all orders for this visit:  Essential hypertension- Her blood pressure is not adequately well controlled.  Will restart losartan. -     losartan (COZAAR) 50 MG tablet; Take 1 tablet (50 mg total) by mouth daily.  Primary osteoarthritis involving multiple joints -     meloxicam (MOBIC) 7.5 MG tablet; Take 1  tablet (7.5 mg total) by mouth daily.  Acute seborrheic dermatitis -     ketoconazole (NIZORAL) 2 % cream; Apply 1 application topically 2 (two) times daily. -     desonide (DESOWEN) 0.05 % lotion; Apply topically 2 (two) times daily.  Need for influenza vaccination -     Flu vaccine HIGH DOSE PF (Fluzone High dose)   I have discontinued Kaylee Li's pravastatin. I am also having her maintain her aspirin, acetaminophen, ketoconazole, desonide, meloxicam, and losartan.  Meds ordered this encounter  Medications  . ketoconazole (NIZORAL) 2 % cream    Sig: Apply 1 application topically 2 (two) times daily.    Dispense:  60 g    Refill:  5  . desonide (DESOWEN) 0.05 % lotion    Sig: Apply topically 2 (two) times daily.    Dispense:  59 mL    Refill:  5  . meloxicam (MOBIC) 7.5 MG tablet    Sig: Take 1 tablet (7.5 mg total) by mouth daily.    Dispense:  90 tablet    Refill:  1  . losartan (COZAAR) 50 MG tablet    Sig: Take 1 tablet (50 mg total) by mouth daily.    Dispense:  90 tablet    Refill:  1     Follow-up: Return in about 3 months (around 05/30/2018).  Scarlette Calico, MD

## 2018-05-28 ENCOUNTER — Ambulatory Visit: Payer: Medicare HMO | Admitting: Internal Medicine

## 2020-11-30 ENCOUNTER — Telehealth: Payer: Self-pay | Admitting: Internal Medicine

## 2020-11-30 NOTE — Telephone Encounter (Signed)
Patient was wondering if she could reestablish care. Please advise.

## 2020-12-03 ENCOUNTER — Ambulatory Visit: Payer: Medicare HMO | Admitting: Internal Medicine

## 2020-12-08 ENCOUNTER — Other Ambulatory Visit: Payer: Self-pay

## 2020-12-08 ENCOUNTER — Ambulatory Visit (INDEPENDENT_AMBULATORY_CARE_PROVIDER_SITE_OTHER): Payer: Medicare HMO

## 2020-12-08 ENCOUNTER — Ambulatory Visit (INDEPENDENT_AMBULATORY_CARE_PROVIDER_SITE_OTHER): Payer: Medicare HMO | Admitting: Internal Medicine

## 2020-12-08 ENCOUNTER — Encounter: Payer: Self-pay | Admitting: Internal Medicine

## 2020-12-08 VITALS — BP 146/96 | HR 89 | Temp 97.8°F | Resp 20 | Ht 66.0 in | Wt 96.0 lb

## 2020-12-08 DIAGNOSIS — E440 Moderate protein-calorie malnutrition: Secondary | ICD-10-CM

## 2020-12-08 DIAGNOSIS — E785 Hyperlipidemia, unspecified: Secondary | ICD-10-CM | POA: Diagnosis not present

## 2020-12-08 DIAGNOSIS — Z0001 Encounter for general adult medical examination with abnormal findings: Secondary | ICD-10-CM

## 2020-12-08 DIAGNOSIS — J449 Chronic obstructive pulmonary disease, unspecified: Secondary | ICD-10-CM

## 2020-12-08 DIAGNOSIS — R059 Cough, unspecified: Secondary | ICD-10-CM

## 2020-12-08 DIAGNOSIS — E559 Vitamin D deficiency, unspecified: Secondary | ICD-10-CM | POA: Diagnosis not present

## 2020-12-08 DIAGNOSIS — E538 Deficiency of other specified B group vitamins: Secondary | ICD-10-CM

## 2020-12-08 DIAGNOSIS — N1832 Chronic kidney disease, stage 3b: Secondary | ICD-10-CM | POA: Diagnosis not present

## 2020-12-08 DIAGNOSIS — I10 Essential (primary) hypertension: Secondary | ICD-10-CM

## 2020-12-08 DIAGNOSIS — G301 Alzheimer's disease with late onset: Secondary | ICD-10-CM | POA: Diagnosis not present

## 2020-12-08 DIAGNOSIS — Z23 Encounter for immunization: Secondary | ICD-10-CM | POA: Insufficient documentation

## 2020-12-08 DIAGNOSIS — F0280 Dementia in other diseases classified elsewhere without behavioral disturbance: Secondary | ICD-10-CM

## 2020-12-08 LAB — CBC WITH DIFFERENTIAL/PLATELET
Basophils Absolute: 0.1 10*3/uL (ref 0.0–0.1)
Basophils Relative: 0.7 % (ref 0.0–3.0)
Eosinophils Absolute: 0.2 10*3/uL (ref 0.0–0.7)
Eosinophils Relative: 1.6 % (ref 0.0–5.0)
HCT: 36.4 % (ref 36.0–46.0)
Hemoglobin: 11.6 g/dL — ABNORMAL LOW (ref 12.0–15.0)
Lymphocytes Relative: 9 % — ABNORMAL LOW (ref 12.0–46.0)
Lymphs Abs: 0.9 10*3/uL (ref 0.7–4.0)
MCHC: 32 g/dL (ref 30.0–36.0)
MCV: 84.8 fl (ref 78.0–100.0)
Monocytes Absolute: 1.3 10*3/uL — ABNORMAL HIGH (ref 0.1–1.0)
Monocytes Relative: 12.8 % — ABNORMAL HIGH (ref 3.0–12.0)
Neutro Abs: 7.6 10*3/uL (ref 1.4–7.7)
Neutrophils Relative %: 75.9 % (ref 43.0–77.0)
Platelets: 301 10*3/uL (ref 150.0–400.0)
RBC: 4.29 Mil/uL (ref 3.87–5.11)
RDW: 15 % (ref 11.5–15.5)
WBC: 10 10*3/uL (ref 4.0–10.5)

## 2020-12-08 LAB — LIPID PANEL
Cholesterol: 195 mg/dL (ref 0–200)
HDL: 95.5 mg/dL (ref >39.00)
LDL Cholesterol: 84 mg/dL (ref 0–99)
NonHDL: 99.68
Total CHOL/HDL Ratio: 2
Triglycerides: 79 mg/dL (ref 0.0–149.0)
VLDL: 15.8 mg/dL (ref 0.0–40.0)

## 2020-12-08 LAB — HEPATIC FUNCTION PANEL
ALT: 8 U/L (ref 0–35)
AST: 10 U/L (ref 0–37)
Albumin: 3.9 g/dL (ref 3.5–5.2)
Alkaline Phosphatase: 95 U/L (ref 39–117)
Bilirubin, Direct: 0 mg/dL (ref 0.0–0.3)
Total Bilirubin: 0.3 mg/dL (ref 0.2–1.2)
Total Protein: 6.8 g/dL (ref 6.0–8.3)

## 2020-12-08 LAB — BASIC METABOLIC PANEL
BUN: 16 mg/dL (ref 6–23)
CO2: 28 mEq/L (ref 19–32)
Calcium: 8.5 mg/dL (ref 8.4–10.5)
Chloride: 105 mEq/L (ref 96–112)
Creatinine, Ser: 0.89 mg/dL (ref 0.40–1.20)
GFR: 57.78 mL/min — ABNORMAL LOW (ref >60.00)
Glucose, Bld: 107 mg/dL — ABNORMAL HIGH (ref 70–99)
Potassium: 4 mEq/L (ref 3.5–5.1)
Sodium: 140 mEq/L (ref 135–145)

## 2020-12-08 LAB — TSH: TSH: 2.05 u[IU]/mL (ref 0.35–4.50)

## 2020-12-08 LAB — VITAMIN D 25 HYDROXY (VIT D DEFICIENCY, FRACTURES): VITD: <7 ng/mL — ABNORMAL LOW (ref 30.00–100.00)

## 2020-12-08 LAB — VITAMIN B12: Vitamin B-12: 215 pg/mL (ref 211–911)

## 2020-12-08 LAB — FOLATE: Folate: 10.1 ng/mL (ref >5.9)

## 2020-12-08 NOTE — Patient Instructions (Signed)
Health Maintenance, Female Adopting a healthy lifestyle and getting preventive care are important in promoting health and wellness. Ask your health care provider about:  The right schedule for you to have regular tests and exams.  Things you can do on your own to prevent diseases and keep yourself healthy. What should I know about diet, weight, and exercise? Eat a healthy diet   Eat a diet that includes plenty of vegetables, fruits, low-fat dairy products, and lean protein.  Do not eat a lot of foods that are high in solid fats, added sugars, or sodium. Maintain a healthy weight Body mass index (BMI) is used to identify weight problems. It estimates body fat based on height and weight. Your health care provider can help determine your BMI and help you achieve or maintain a healthy weight. Get regular exercise Get regular exercise. This is one of the most important things you can do for your health. Most adults should:  Exercise for at least 150 minutes each week. The exercise should increase your heart rate and make you sweat (moderate-intensity exercise).  Do strengthening exercises at least twice a week. This is in addition to the moderate-intensity exercise.  Spend less time sitting. Even light physical activity can be beneficial. Watch cholesterol and blood lipids Have your blood tested for lipids and cholesterol at 84 years of age, then have this test every 5 years. Have your cholesterol levels checked more often if:  Your lipid or cholesterol levels are high.  You are older than 84 years of age.  You are at high risk for heart disease. What should I know about cancer screening? Depending on your health history and family history, you may need to have cancer screening at various ages. This may include screening for:  Breast cancer.  Cervical cancer.  Colorectal cancer.  Skin cancer.  Lung cancer. What should I know about heart disease, diabetes, and high blood  pressure? Blood pressure and heart disease  High blood pressure causes heart disease and increases the risk of stroke. This is more likely to develop in people who have high blood pressure readings, are of African descent, or are overweight.  Have your blood pressure checked: ? Every 3-5 years if you are 18-39 years of age. ? Every year if you are 40 years old or older. Diabetes Have regular diabetes screenings. This checks your fasting blood sugar level. Have the screening done:  Once every three years after age 40 if you are at a normal weight and have a low risk for diabetes.  More often and at a younger age if you are overweight or have a high risk for diabetes. What should I know about preventing infection? Hepatitis B If you have a higher risk for hepatitis B, you should be screened for this virus. Talk with your health care provider to find out if you are at risk for hepatitis B infection. Hepatitis C Testing is recommended for:  Everyone born from 1945 through 1965.  Anyone with known risk factors for hepatitis C. Sexually transmitted infections (STIs)  Get screened for STIs, including gonorrhea and chlamydia, if: ? You are sexually active and are younger than 84 years of age. ? You are older than 84 years of age and your health care provider tells you that you are at risk for this type of infection. ? Your sexual activity has changed since you were last screened, and you are at increased risk for chlamydia or gonorrhea. Ask your health care provider if   you are at risk.  Ask your health care provider about whether you are at high risk for HIV. Your health care provider may recommend a prescription medicine to help prevent HIV infection. If you choose to take medicine to prevent HIV, you should first get tested for HIV. You should then be tested every 3 months for as long as you are taking the medicine. Pregnancy  If you are about to stop having your period (premenopausal) and  you may become pregnant, seek counseling before you get pregnant.  Take 400 to 800 micrograms (mcg) of folic acid every day if you become pregnant.  Ask for birth control (contraception) if you want to prevent pregnancy. Osteoporosis and menopause Osteoporosis is a disease in which the bones lose minerals and strength with aging. This can result in bone fractures. If you are 65 years old or older, or if you are at risk for osteoporosis and fractures, ask your health care provider if you should:  Be screened for bone loss.  Take a calcium or vitamin D supplement to lower your risk of fractures.  Be given hormone replacement therapy (HRT) to treat symptoms of menopause. Follow these instructions at home: Lifestyle  Do not use any products that contain nicotine or tobacco, such as cigarettes, e-cigarettes, and chewing tobacco. If you need help quitting, ask your health care provider.  Do not use street drugs.  Do not share needles.  Ask your health care provider for help if you need support or information about quitting drugs. Alcohol use  Do not drink alcohol if: ? Your health care provider tells you not to drink. ? You are pregnant, may be pregnant, or are planning to become pregnant.  If you drink alcohol: ? Limit how much you use to 0-1 drink a day. ? Limit intake if you are breastfeeding.  Be aware of how much alcohol is in your drink. In the [place] one drink equals one 12 oz bottle of beer (355 mL), one 5 oz glass of wine (148 mL), or one 1 oz glass of hard liquor (44 mL). General instructions  Schedule regular health, dental, and eye exams.  Stay current with your vaccines.  Tell your health care provider if: ? You often feel depressed. ? You have ever been abused or do not feel safe at home. Summary  Adopting a healthy lifestyle and getting preventive care are important in promoting health and wellness.  Follow your health care provider's instructions about healthy  diet, exercising, and getting tested or screened for diseases.  Follow your health care provider's instructions on monitoring your cholesterol and blood pressure. This information is not intended to replace advice given to you by your health care provider. Make sure you discuss any questions you have with your health care provider. Document Revised: 12/05/2018 Document Reviewed: 12/05/2018 [employer] Patient Education  2020 [employer]  

## 2020-12-08 NOTE — Progress Notes (Signed)
Subjective:  Patient ID: Kaylee Li, female    DOB: 05-19-1930  Age: 86 y.o. MRN: [id number]  CC: COPD, Cough, Annual Exam, and Osteoarthritis  This visit occurred during the SARS-CoV-2 public health emergency.  Safety protocols were in place, including screening questions prior to the visit, additional usage of staff PPE, and extensive cleaning of exam room while observing appropriate contact time as indicated for disinfecting solutions.    HPI Kaylee Li presents for a CPX.  She comes in with her son today.  She continues to smoke cigarettes and has a chronic cough and shortness of breath.  She denies wheezing, chest pain, hemoptysis, fever, or chills.  She continues to lose weight.  She is not interested in using any inhalers or in a smoking cessation program.  She does not monitor her blood pressure.  She is not taking any antihypertensives.  She denies headache, blurred vision, edema, or palpitations.  Her son will be in [place] for the next 6 months.   Outpatient Medications Prior to Visit  Medication Sig Dispense Refill  . acetaminophen (TYLENOL) 500 MG tablet Take 1,000 mg by mouth 3 (three) times daily. Reported on 03/21/2016    . aspirin 81 MG tablet Take 81 mg by mouth daily.    Marland Kitchen desonide (DESOWEN) 0.05 % lotion Apply topically 2 (two) times daily. 59 mL 5  . ketoconazole (NIZORAL) 2 % cream Apply 1 application topically 2 (two) times daily. 60 g 5  . losartan (COZAAR) 50 MG tablet Take 1 tablet (50 mg total) by mouth daily. 90 tablet 1  . meloxicam (MOBIC) 7.5 MG tablet Take 1 tablet (7.5 mg total) by mouth daily. 90 tablet 1   No facility-administered medications prior to visit.    ROS Review of Systems  Constitutional: Positive for unexpected weight change (wt loss). Negative for appetite change, chills, diaphoresis and fatigue.  HENT: Negative.  Negative for trouble swallowing and voice change.   Eyes: Negative.   Respiratory: Positive for cough and shortness  of breath. Negative for choking, chest tightness, wheezing and stridor.   Cardiovascular: Negative for chest pain, palpitations and leg swelling.  Gastrointestinal: Negative for abdominal pain, constipation, diarrhea, nausea and vomiting.  Endocrine: Negative.   Genitourinary: Negative.  Negative for difficulty urinating.  Musculoskeletal: Negative.   Skin: Negative.  Negative for color change and pallor.  Neurological: Negative.  Negative for dizziness, weakness and light-headedness.  Hematological: Negative for adenopathy. Does not bruise/bleed easily.  Psychiatric/Behavioral: Negative.     Objective:  BP (!) 146/96   Pulse 89   Temp 97.8 F (36.6 C) (Oral)   Resp 20   Ht 5\' 6"  (1.676 m)   Wt 96 lb (43.5 kg)   SpO2 99%   BMI 15.49 kg/m   BP Readings from Last 3 Encounters:  12/08/20 (!) 146/96  02/27/18 (!) 160/100  12/18/17 (!) 142/90    Wt Readings from Last 3 Encounters:  12/08/20 96 lb (43.5 kg)  02/27/18 99 lb (44.9 kg)  12/18/17 100 lb (45.4 kg)    Physical Exam Vitals reviewed.  Constitutional:      Appearance: She is cachectic. She is ill-appearing (thin ,frail, pale). She is not toxic-appearing or diaphoretic.  HENT:     Nose: Nose normal.     Mouth/Throat:     Mouth: Mucous membranes are moist.  Eyes:     General: No scleral icterus.    Conjunctiva/sclera: Conjunctivae normal.  Cardiovascular:     Rate and  Rhythm: Normal rate and regular rhythm.     Heart sounds: No murmur heard.   Pulmonary:     Effort: Pulmonary effort is normal.     Breath sounds: Examination of the right-upper field reveals decreased breath sounds. Examination of the left-upper field reveals decreased breath sounds. Examination of the right-middle field reveals decreased breath sounds. Examination of the left-middle field reveals decreased breath sounds. Examination of the right-lower field reveals decreased breath sounds. Examination of the left-lower field reveals decreased  breath sounds. Decreased breath sounds present. No wheezing, rhonchi or rales.  Abdominal:     General: Abdomen is flat.     Palpations: There is no mass.     Tenderness: There is no abdominal tenderness. There is no guarding.  Musculoskeletal:        General: Normal range of motion.     Cervical back: Neck supple.     Right lower leg: No edema.     Left lower leg: No edema.  Lymphadenopathy:     Cervical: No cervical adenopathy.  Skin:    General: Skin is warm and dry.     Coloration: Skin is pale.  Neurological:     General: No focal deficit present.     Mental Status: She is alert.  Psychiatric:        Attention and Perception: She is inattentive.        Mood and Affect: Mood normal.        Speech: Speech is delayed and tangential.        Behavior: Behavior is slowed. Behavior is not withdrawn. Behavior is cooperative.        Thought Content: Thought content normal.        Cognition and Memory: Cognition is impaired. Memory is impaired.        Judgment: Judgment normal.     Lab Results  Component Value Date   WBC 10.0 12/08/2020   HGB 11.6 (L) 12/08/2020   HCT 36.4 12/08/2020   PLT 301.0 12/08/2020   GLUCOSE 107 (H) 12/08/2020   CHOL 195 12/08/2020   TRIG 79.0 12/08/2020   HDL 95.50 12/08/2020   LDLDIRECT 112.0 12/18/2017   LDLCALC 84 12/08/2020   ALT 8 12/08/2020   AST 10 12/08/2020   NA 140 12/08/2020   K 4.0 12/08/2020   CL 105 12/08/2020   CREATININE 0.89 12/08/2020   BUN 16 12/08/2020   CO2 28 12/08/2020   TSH 2.05 12/08/2020   HGBA1C 5.6 07/02/2015    DG Lumbar Spine Complete  Result Date: 12/18/2017 CLINICAL DATA:  Lower back AND bilateral leg pain x intermittent x 6-7 mos, NKI. EXAM: LUMBAR SPINE - COMPLETE 4+ VIEW COMPARISON:  None. FINDINGS: Negative for fracture. Moderate narrowing of the L1-2 and L4-5 interspaces. Mild narrowing L2-3, L3-4. Grade 2 anterolisthesis L4-5. No convincing pars defect identified. Extensive aortoiliac arterial  calcifications without suggestion of aneurysm. IMPRESSION: 1. Negative for fracture or other acute bone abnormality. 2. Multilevel degenerative disc disease with grade 2 anterolisthesis L4-5. 3.  Aortic Atherosclerosis (ICD10-170.0) Electronically Signed   By: Lucrezia Europe M.D.   On: 12/18/2017 09:24   DG Chest 2 View  Result Date: 12/09/2020 CLINICAL DATA:  Chronic cough EXAM: CHEST - 2 VIEW COMPARISON:  01/17/2013 FINDINGS: The lungs are hyperinflated with diffuse interstitial prominence. No focal airspace consolidation or pulmonary edema. No pleural effusion or pneumothorax. Normal cardiomediastinal contours. IMPRESSION: COPD without acute airspace disease. Electronically Signed   By: Cletus Gash.D.  On: 12/09/2020 03:37    Assessment & Plan:   Maygen was seen today for copd, cough, annual exam and osteoarthritis.  Diagnoses and all orders for this visit:  Essential hypertension- Her blood pressure is adequately well controlled.  Labs are negative for secondary causes or endorgan damage. -     Basic metabolic panel; Future -     Urinalysis, Routine w reflex microscopic; Future -     TSH; Future -     Basic metabolic panel -     TSH -     Urinalysis, Routine w reflex microscopic  COPD with chronic bronchitis ([place])- She is not willing to use any inhalers.  I recommended she quit smoking.  I have asked home health care to evaluate her. -     Ambulatory referral to Home Health  B12 deficiency- She is mildly anemic but her B12 and folate levels are normal. -     CBC with Differential/Platelet; Future -     Vitamin B12; Future -     Folate; Future -     CBC with Differential/Platelet -     Folate -     Vitamin B12  Hyperlipidemia with target LDL less than 130- Statin therapy is not indicated. -     Lipid panel; Future -     TSH; Future -     Hepatic function panel; Future -     Hepatic function panel -     Lipid panel -     TSH  Encounter for general adult medical examination  with abnormal findings- Exam completed, labs reviewed, vaccines reviewed and updated, no cancer screenings are indicated,  Cough- Her chest x-ray is negative for mass or infiltrate. -     DG Chest 2 View; Future  Vitamin D deficiency -     VITAMIN D 25 Hydroxy (Vit-D Deficiency, Fractures); Future -     VITAMIN D 25 Hydroxy (Vit-D Deficiency, Fractures) -     Cholecalciferol 50 MCG (2000 UT) TABS; Take 1 tablet (2,000 Units total) by mouth daily.  Late onset Alzheimer's dementia without behavioral disturbance ([place])- No work-up or treatment is indicated. -     Ambulatory referral to [facility]  Needs flu shot -     Flu Vaccine QUAD High Dose(Fluad)  Need for vaccination for pneumococcus -     Pneumococcal polysaccharide vaccine 23-valent greater than or equal to 2yo subcutaneous/IM  Stage 3b chronic kidney disease ([place])- Her blood pressure is well controlled.  She will avoid nephrotoxic agents.  Moderate protein-calorie malnutrition ([place])- I have asked palliative care to help her.   I am having Kaylee Li start on Cholecalciferol. I am also having her maintain her aspirin, acetaminophen, ketoconazole, desonide, meloxicam, and losartan.  Meds ordered this encounter  Medications  . Cholecalciferol 50 MCG (2000 UT) TABS    Sig: Take 1 tablet (2,000 Units total) by mouth daily.    Dispense:  90 tablet    Refill:  1   In addition to time spent on CPE, I spent 50 minutes in preparing to see the patient by review of recent labs, imaging and procedures, obtaining and reviewing separately obtained history, communicating with the patient and family or caregiver, ordering medications, tests or procedures, and documenting clinical information in the EHR including the differential Dx, treatment, and any further evaluation and other management of 1. Essential hypertension 2. COPD with chronic bronchitis ([place]) 3. B12 deficiency 4. Hyperlipidemia with target LDL less than 130 5. Cough  6.  Vitamin D deficiency 7. Late onset Alzheimer's dementia without behavioral disturbance ([facility]) 8 Stage 3b chronic kidney disease ([place]) 9. Moderate protein-calorie malnutrition ([place])     Follow-up: Return in about 6 months (around 06/08/2021).  Scarlette Calico, MD

## 2020-12-09 DIAGNOSIS — E440 Moderate protein-calorie malnutrition: Secondary | ICD-10-CM | POA: Insufficient documentation

## 2020-12-09 DIAGNOSIS — E559 Vitamin D deficiency, unspecified: Secondary | ICD-10-CM | POA: Insufficient documentation

## 2020-12-09 MED ORDER — CHOLECALCIFEROL 50 MCG (2000 UT) PO TABS: 1.0000 | ORAL_TABLET | Freq: Every day | ORAL | 1 refills | Status: AC

## 2020-12-15 ENCOUNTER — Telehealth: Payer: Self-pay

## 2020-12-15 NOTE — Telephone Encounter (Signed)
Attempted to contact patient's granddaughter Loma Sousa to schedule a Palliative care consult appointment. No answer left a voicemail to return call.

## 2020-12-17 ENCOUNTER — Telehealth: Payer: Self-pay

## 2020-12-17 NOTE — Telephone Encounter (Signed)
Spoke with patient's son [name] and scheduled an in-person Palliative Consult for 01/05/21 @ 9AM   COVID screening was negative. No pets in home. Patient lives alone.   Consent obtained; updated Outlook/Netsmart/Team List and Epic.

## 2021-01-05 ENCOUNTER — Other Ambulatory Visit: Payer: Self-pay | Admitting: Nurse Practitioner

## 2021-01-19 ENCOUNTER — Other Ambulatory Visit: Payer: Self-pay

## 2021-01-19 ENCOUNTER — Other Ambulatory Visit: Payer: Medicare HMO | Admitting: Nurse Practitioner

## 2021-01-19 DIAGNOSIS — G309 Alzheimer's disease, unspecified: Secondary | ICD-10-CM

## 2021-01-19 DIAGNOSIS — Z515 Encounter for palliative care: Secondary | ICD-10-CM | POA: Diagnosis not present

## 2021-01-19 DIAGNOSIS — F0280 Dementia in other diseases classified elsewhere without behavioral disturbance: Secondary | ICD-10-CM

## 2021-01-19 DIAGNOSIS — R69 Illness, unspecified: Secondary | ICD-10-CM | POA: Diagnosis not present

## 2021-01-19 NOTE — Progress Notes (Signed)
[facility] Consult Note Telephone: [phone]  Fax: [phone]  PATIENT NAME: Kaylee Li [place] [address] [phone] (home)  DOB: 05-04-1930 MRN: [id number]  PRIMARY CARE PROVIDER:    Janith Lima, MD,  [place] [address] [phone]  REFERRING PROVIDER:   Janith Lima, MD [address] [place],   [address] [phone]  RESPONSIBLE PARTY:   Extended Emergency Contact Information Primary Emergency Contact: Champlain,Courtney Address: [address]           [place],  [address] [place] Phone: [phone] Relation: Granddaughter Meah Jiron (son) [phone]  I met face to face with patient in home.  ASSESSMENT AND RECOMMENDATIONS:   Advance Care Planning: Patient alone on home, not able to substantiably able to participate in discussion due to poor cognition related to advance dementia. ACP discussion held with son [name] over telephone. Discussion consisted of building trust and discussions on Palliative care medicine as a specialized medical care for people living with serious illness, aimed at facilitating better quality of life through symptoms relief, assisting with advance care planing and establishing goals of care. Son expressed appreciation for education provided on Palliative care and how it differs from Hospice service. Palliative care will continue to provide support to patient, family and the medical team. Goal of care: Goal of care is comfort. Son desires for patient to be as comfortable as possible.  Directives: Son report patient has a living will. Report patient does not want to be resuscitated in the event of cardiac or respiratory arrest. DNR form signed and placed on patient's fridge. Copy sent via email to son and also uploaded to [facility] EMR. Blank copy of MOST form sent to son, will review and complete at next  visit.   Cognitive / Functional decline/ Symptom Management:  Patient awake and alert, very confused. Oriented to self only. Not aware that her husband is deceased, talked about him like he still lives in the house. Son report patient independent with her ADLs, ambulates independently and does not wander outside the house,she however noted to pace within the home. Son report patient not on any prescription medication, report patient does not like taking meds, would take Ibuprofen as needed for pain. Patient denied pain today. No report of fever, chills, or acute change in function. Son report considering long term care placement for patient safety. Weight loss: BMI 15.49k/m2.  Recommendation: recommend starting patient on nutritional supplement like Ensure between meals. May consider starting patient on appetite stimulant like Megace or Remeron. Provided general support and encouragement, no other unmet needs identified. Would place a social work consult to provide guidance for long term care placement.   Follow up Palliative Care Visit: Palliative care will continue to follow for goals of care clarification and symptom management. Return in about 4 weeks or prn.  Family /Caregiver/Community Supports: Patient is a widow, she lives alone. She has a personal care giver that comes 4hrs a day on Mondays, Wednesdays and Fridays. Her son and granddaughter fills in as able.  I spent 60 minutes providing this consultation. More than 50% of the time in this consultation was spent counseling and coordinating communication.   CHIEF COMPLAINT: Initial palliative care consult  History obtained from review of EMR, discussion with son. Records reviewed and summarized bellow.  HISTORY OF PRESENT ILLNESS:  Kaylee Li is a 87 y.o. year old female with  multiple medical problems including Alzheimer dementia without behavior concerns (FAST 5). CKD 3, protein calorie malnutrition, current cigarette smoker. Palliative  Care was asked to follow this patient by consultation request of Janith Lima, MD to help address advance care planning and complex decision making.   CODE STATUS: DNR  PPS: 70%  HOSPICE ELIGIBILITY/DIAGNOSIS: TBD  PHYSICAL EXAM / ROS:   Current and past weights: 96lb, Ht 35f6", BMI 15.49kg/m2 on 12/08/2020 General: NAD, frail appearing, thin Cardiovascular: denied chest pain, no edema  Pulmonary: no cough, no SOB, room air GI: appetite poor, continent of bowel GU: denies dysuria, continent of urine MSK: no joint and ROM abnormalities, ambulatory Skin: no rashes or wounds noted on exposed skin Neurological: Weakness, confused Psych: non -anxious affect  PAST MEDICAL HISTORY:  Past Medical History:  Diagnosis Date  . Anemia   . Arthritis   . COPD (chronic obstructive pulmonary disease) ([facility]   . Emphysema of lung ([facility]   . Hyperlipidemia   . Hypertension     SOCIAL HX:  Social History   Tobacco Use  . Smoking status: Current Every Day Smoker    Packs/day: 1.00    Years: 60.00    Pack years: 60.00    Types: Cigarettes  . Smokeless tobacco: Never Used  Substance Use Topics  . Alcohol use: Yes    Alcohol/week: 3.0 standard drinks    Types: 3 Glasses of wine per week    Comment: every night   FAMILY HX:  Family History  Problem Relation Age of Onset  . Cancer Neg Hx   . Hypertension Neg Hx   . Heart disease Neg Hx     ALLERGIES: No Known Allergies   PERTINENT MEDICATIONS:  Outpatient Encounter Medications as of 01/19/2021  Medication Sig  . acetaminophen (TYLENOL) 500 MG tablet Take 1,000 mg by mouth 3 (three) times daily. Reported on 03/21/2016  . aspirin 81 MG tablet Take 81 mg by mouth daily.  . Cholecalciferol 50 MCG (2000 UT) TABS Take 1 tablet (2,000 Units total) by mouth daily.  .Marland Kitchendesonide (DESOWEN) 0.05 % lotion Apply topically 2 (two) times daily.  .Marland Kitchenketoconazole (NIZORAL) 2 % cream Apply 1 application topically 2 (two) times daily.  .Marland Kitchenlosartan  (COZAAR) 50 MG tablet Take 1 tablet (50 mg total) by mouth daily.  . meloxicam (MOBIC) 7.5 MG tablet Take 1 tablet (7.5 mg total) by mouth daily.   No facility-administered encounter medications on file as of 01/19/2021.    Thank you for the opportunity to participate in the care of Ms. PDossie Arbour The palliative care team will continue to follow. Please call our office at [phone] we can be of additional assistance.  QJari Favre DNP, AGPCNP-BC

## 2021-02-09 ENCOUNTER — Telehealth: Payer: Self-pay | Admitting: Nurse Practitioner

## 2021-02-09 NOTE — Telephone Encounter (Signed)
Rec'd call from patient's son, [name] and he said that the scheduler had called and wanted to reschedule the Palliative f/u visit, this was rescheduled for 03/03/21 @ 10:30 AM.

## 2021-03-03 ENCOUNTER — Other Ambulatory Visit: Payer: Medicare HMO | Admitting: Nurse Practitioner

## 2021-03-03 ENCOUNTER — Other Ambulatory Visit: Payer: Self-pay

## 2021-06-14 ENCOUNTER — Ambulatory Visit: Payer: Medicare HMO | Admitting: Internal Medicine

## 2021-06-14 DIAGNOSIS — Z0289 Encounter for other administrative examinations: Secondary | ICD-10-CM

## 2021-09-08 ENCOUNTER — Telehealth: Payer: Self-pay | Admitting: Lab

## 2021-09-08 NOTE — Progress Notes (Signed)
  Chronic Care Management   Outreach Note  09/08/2021 Name: MILY PEEPLES MRN: [id number] DOB: [birth year]  Referred by: Janith Lima, MD Reason for referral : Medication Management   An unsuccessful telephone outreach was attempted today. The patient was referred to the pharmacist for assistance with care management and care coordination.   Follow Up Plan:   [place]

## 2021-10-15 ENCOUNTER — Ambulatory Visit (INDEPENDENT_AMBULATORY_CARE_PROVIDER_SITE_OTHER): Payer: Medicare HMO | Admitting: Internal Medicine

## 2021-10-15 ENCOUNTER — Other Ambulatory Visit: Payer: Self-pay

## 2021-10-15 ENCOUNTER — Encounter: Payer: Self-pay | Admitting: Internal Medicine

## 2021-10-15 VITALS — BP 144/70 | HR 101 | Resp 18 | Ht 66.0 in | Wt 100.4 lb

## 2021-10-15 DIAGNOSIS — F0280 Dementia in other diseases classified elsewhere without behavioral disturbance: Secondary | ICD-10-CM

## 2021-10-15 DIAGNOSIS — G301 Alzheimer's disease with late onset: Secondary | ICD-10-CM | POA: Diagnosis not present

## 2021-10-15 DIAGNOSIS — R69 Illness, unspecified: Secondary | ICD-10-CM | POA: Diagnosis not present

## 2021-10-15 NOTE — Assessment & Plan Note (Signed)
Not taking any medications currently. Needs housing help and referral done to our social worker today. Spent time explaining to son about the care system.

## 2021-10-15 NOTE — Patient Instructions (Signed)
We will get the social worker in touch with you guys to help make this transition.

## 2021-10-15 NOTE — Progress Notes (Signed)
   Subjective:   Patient ID: Kaylee Li, female    DOB: [birth year], 87 y.o.   MRN: [id number]  HPI Patient is an 87 YO female coming in with son for concerns about living situation. Memory changes progressive and he is unable to care for her. They have some in home help currently but financially will not be able to continue and he is still working. She is unsafe in home alone.  Review of Systems  Unable to perform ROS: Dementia   Objective:  Physical Exam Constitutional:      Appearance: She is well-developed.  HENT:     Head: Normocephalic and atraumatic.  Cardiovascular:     Rate and Rhythm: Normal rate and regular rhythm.  Pulmonary:     Effort: Pulmonary effort is normal. No respiratory distress.     Breath sounds: Normal breath sounds. No wheezing or rales.  Abdominal:     General: Bowel sounds are normal. There is no distension.     Palpations: Abdomen is soft.     Tenderness: There is no abdominal tenderness. There is no rebound.  Musculoskeletal:     Cervical back: Normal range of motion.  Skin:    General: Skin is warm and dry.  Neurological:     Mental Status: She is alert and oriented to person, place, and time.     Coordination: Coordination normal.    Vitals:   10/15/21 1430  BP: (!) 144/70  Pulse: (!) 101  Resp: 18  SpO2: 96%  Weight: 100 lb 6.4 oz (45.5 kg)  Height: 5\' 6"  (1.676 m)    This visit occurred during the SARS-CoV-2 public health emergency.  Safety protocols were in place, including screening questions prior to the visit, additional usage of staff PPE, and extensive cleaning of exam room while observing appropriate contact time as indicated for disinfecting solutions.   Assessment & Plan:  Visit time 20 minutes in face to face communication with patient and coordination of care, additional 5 minutes spent in record review, coordination or care, ordering tests, communicating/referring to other healthcare professionals, documenting in medical  records all on the same day of the visit for total time 25 minutes spent on the visit.

## 2021-10-18 ENCOUNTER — Telehealth: Payer: Self-pay | Admitting: *Deleted

## 2021-10-18 NOTE — Chronic Care Management (AMB) (Signed)
  Chronic Care Management   Note  10/18/2021 Name: Kaylee Li MRN: [id number] DOB: Jun 15, 1930  Kaylee Li is a 87 y.o. year old female who is a primary care patient of Janith Lima, MD. I reached out to Kaylee Li by phone today in response to a referral sent by Ms. Wilmer Floor Constable's PCP.  Ms. Putnam was given information about Chronic Care Management services today including:  CCM service includes personalized support from designated clinical staff supervised by her physician, including individualized plan of care and coordination with other care providers 24/7 contact phone numbers for assistance for urgent and routine care needs. Service will only be billed when office clinical staff spend 20 minutes or more in a month to coordinate care. Only one practitioner may furnish and bill the service in a calendar month. The patient may stop CCM services at any time (effective at the end of the month) by phone call to the office staff. The patient is responsible for co-pay (up to 20% after annual deductible is met) if co-pay is required by the individual health plan.   Son POA Madisson Kulaga  verbally agreed to assistance and services provided by embedded care coordination/care management team today.  Follow up plan: Telephone appointment with care management team member scheduled for:10/25/21  [facility] Management  Direct Dial: [phone]

## 2021-10-25 ENCOUNTER — Ambulatory Visit (INDEPENDENT_AMBULATORY_CARE_PROVIDER_SITE_OTHER): Payer: Medicare HMO | Admitting: *Deleted

## 2021-10-25 ENCOUNTER — Other Ambulatory Visit: Payer: Self-pay | Admitting: *Deleted

## 2021-10-25 DIAGNOSIS — J449 Chronic obstructive pulmonary disease, unspecified: Secondary | ICD-10-CM | POA: Diagnosis not present

## 2021-10-25 DIAGNOSIS — F0280 Dementia in other diseases classified elsewhere without behavioral disturbance: Secondary | ICD-10-CM

## 2021-10-25 DIAGNOSIS — G301 Alzheimer's disease with late onset: Secondary | ICD-10-CM

## 2021-10-25 DIAGNOSIS — R69 Illness, unspecified: Secondary | ICD-10-CM | POA: Diagnosis not present

## 2021-10-25 NOTE — Chronic Care Management (AMB) (Signed)
Chronic Care Management    Clinical Social Work Note  10/25/2021 Name: Kaylee Li MRN: [id number] DOB: 12-20-1930  Kaylee Li is a 87 y.o. year old female who is a primary care patient of Janith Lima, MD. The CCM team was consulted to assist the patient with chronic disease management and/or care coordination needs related to: [employer] , Level of Care Concerns, and Caregiver Stress.   Engaged with patient by telephone for initial visit in response to provider referral for social work chronic care management and care coordination services.   Consent to Services:  The patient was given the following information about Chronic Care Management services today, agreed to services, and gave verbal consent: 1. CCM service includes personalized support from designated clinical staff supervised by the primary care provider, including individualized plan of care and coordination with other care providers 2. 24/7 contact phone numbers for assistance for urgent and routine care needs. 3. Service will only be billed when office clinical staff spend 20 minutes or more in a month to coordinate care. 4. Only one practitioner may furnish and bill the service in a calendar month. 5.The patient may stop CCM services at any time (effective at the end of the month) by phone call to the office staff. 6. The patient will be responsible for cost sharing (co-pay) of up to 20% of the service fee (after annual deductible is met). Patient agreed to services and consent obtained.  Patient agreed to services and consent obtained.   Assessment: Review of patient past medical history, allergies, medications, and health status, including review of relevant consultants reports was performed today as part of a comprehensive evaluation and provision of chronic care management and care coordination services.     SDOH (Social Determinants of Health) assessments and interventions performed:  SDOH Interventions     Flowsheet Row Most Recent Value  SDOH Interventions   Transportation Interventions Intervention Not Indicated        Advanced Directives Status: See Care Plan for related entries.  CCM Care Plan  No Known Allergies  Outpatient Encounter Medications as of 10/25/2021  Medication Sig   acetaminophen (TYLENOL) 500 MG tablet Take 1,000 mg by mouth 3 (three) times daily. Reported on 03/21/2016 (Patient not taking: Reported on 10/15/2021)   aspirin 81 MG tablet Take 81 mg by mouth daily. (Patient not taking: Reported on 10/15/2021)   Cholecalciferol 50 MCG (2000 UT) TABS Take 1 tablet (2,000 Units total) by mouth daily. (Patient not taking: Reported on 10/15/2021)   desonide (DESOWEN) 0.05 % lotion Apply topically 2 (two) times daily. (Patient not taking: Reported on 10/15/2021)   ketoconazole (NIZORAL) 2 % cream Apply 1 application topically 2 (two) times daily. (Patient not taking: Reported on 10/15/2021)   losartan (COZAAR) 50 MG tablet Take 1 tablet (50 mg total) by mouth daily. (Patient not taking: Reported on 10/15/2021)   meloxicam (MOBIC) 7.5 MG tablet Take 1 tablet (7.5 mg total) by mouth daily. (Patient not taking: Reported on 10/15/2021)   No facility-administered encounter medications on file as of 10/25/2021.    Patient Active Problem List   Diagnosis Date Noted   Vitamin D deficiency disease 12/09/2020   Moderate protein-calorie malnutrition ([place]) 12/09/2020   Encounter for general adult medical examination with abnormal findings 12/08/2020   Needs flu shot 12/08/2020   Late onset Alzheimer's dementia without behavioral disturbance ([place]) 12/08/2020   Need for vaccination for pneumococcus 12/08/2020   Stage 3b chronic kidney disease ([place]) 12/08/2020  Acute seborrheic dermatitis 06/02/2014   Routine general medical examination at a health care facility 06/02/2014   Vitamin D deficiency 06/02/2014   B12 deficiency 06/02/2014   Cough 01/12/2012   Other screening  mammogram 01/12/2012   Tobacco abuse 01/12/2012   COPD with chronic bronchitis ([place]) 10/29/2008   Hyperlipidemia with target LDL less than 130 10/31/2007   Essential hypertension 10/31/2007   DJD (degenerative joint disease), multiple sites 10/31/2007   Osteoporosis 10/31/2007    Conditions to be addressed/monitored: Dementia; Level of care concerns, Cognitive Deficits, and Memory Deficits  Care Plan : LCSW Plan of Care  Updates made by Deirdre Peer, LCSW since 10/25/2021 12:00 AM     Problem: Long-Term Care Planning      Long-Range Goal: Provide support, resources and direction for seeking placement   Start Date: 10/25/2021  Expected End Date: 12/25/2021  This Visit's Progress: On track  Priority: High  Note:   Current Barriers:   Patient with Dementia has Care Coordination Needs related to facility placement for  memory care/ALF   Dementia advanced- lives with son Patient is no longer able safely have ADL and/or IADL needs met in home environment Knowledge deficits, education and support needs related to level of care concerns Patient/ Family is unable to independently coordinate transition to needed level of care  Patient does not have consistent support to assist with appropriate and safe care at home Level of care concerns, Limited access to caregiver, Cognitive Deficits, Memory Deficits, and Lacks knowledge of community resource:   Clinical Social Work Goal(s):  Over the next 90 days, patient and family will work with care management related to assisted living placement Licensed Clinical Social Worker will collaborate with son to help with identified facilities and assist patient as needed to understand facility selection process Over the next 45 days, patient and/or designated Geophysical data processor will apply for SPECIAL ASSISTANCE Medicaid and select a facility from the list provided or be willing to take the first available facility Interventions completed by  LCSW: Assessed needs and provided education on level of care and facility placement process Collaborated with son to take forms to primary care provider for completion [facility]) Patient interviewed and appropriate assessments performed Discussed plans with patient for ongoing care management follow up and provided patient with direct contact information for care management team Advised patient to apply for Medicaid and research/review facility choices Provided education to patient/caregiver regarding level of care options. Patient Goals/Self-Care Activities Over the next 45 days, patient will:  apply for Medicaid, seek preferred ALF/memory care settings Follow Up Plan: Telephone follow up appointment with care management team member scheduled for: 12/06/21                  Follow Up Plan: Appointment scheduled for SW follow up with client by phone on: 12/06/21      Eduard Clos MSW, [place] Licensed Clinical Social Worker [place] [phone]

## 2021-10-25 NOTE — Patient Instructions (Signed)
Visit Information   PATIENT GOALS:   Goals Addressed             This Visit's Progress    Planning for Long-Term Care-Dementia       Timeframe:  Long-Range Goal Priority:  High Start Date:      10/25/21                       Expected End Date:                  12/25/21     Follow Up Date 12/06/21    -apply for Medicaid- Special Assistance for memory care/assisted living placement -review resources to be mailed to you and consider facility options (tour some/call to inquire, etc) - attend dementia support group - check out dementia classes in the community - check out dementia website - check out other places when staying at home is no longer possible (assisted living center, nursing home) - check out services like in-home help or adult day care - connect with the local or national dementia organization - look at health insurance policy and other saving accounts so you know how much money you have - make a list of future care and financial needs - make a list of people who can help and what they can do    Why is this important?   Learning that you or your loved one has dementia can be scary and stressful.  You can reduce stress by planning.  Preparing for the future is one of the most important things to do.  Thinking about how much care you/your loved one will need and how much it will cost is not easy.  Early on, you/your loved one can be part of making decisions for the future.  Making sure that your/your loved one's wishes for care are known is important.     Notes:         Consent to CCM Services: Ms. Gelber was given information about Chronic Care Management services including:  CCM service includes personalized support from designated clinical staff supervised by her physician, including individualized plan of care and coordination with other care providers 24/7 contact phone numbers for assistance for urgent and routine care needs. Service will only be billed  when office clinical staff spend 20 minutes or more in a month to coordinate care. Only one practitioner may furnish and bill the service in a calendar month. The patient may stop CCM services at any time (effective at the end of the month) by phone call to the office staff. The patient will be responsible for cost sharing (co-pay) of up to 20% of the service fee (after annual deductible is met).  Patient agreed to services and verbal consent obtained.   Patient verbalizes understanding of instructions provided today and agrees to view in [place].  12/06/21 Telephone follow up appointment with care management team member scheduled for:  Eduard Clos MSW, LCSW Licensed Clinical Social Worker [place] [phone]   CLINICAL CARE PLAN: Patient Care Plan: LCSW Plan of Care     Problem Identified: Long-Term Care Planning      Long-Range Goal: Provide support, resources and direction for seeking placement   Start Date: 10/25/2021  Expected End Date: 12/25/2021  This Visit's Progress: On track  Priority: High  Note:   Current Barriers:   Patient with Dementia has Care Coordination Needs related to facility placement for  memory care/ALF   Dementia advanced-  lives with son Patient is no longer able safely have ADL and/or IADL needs met in home environment Knowledge deficits, education and support needs related to level of care concerns Patient/ Family is unable to independently coordinate transition to needed level of care  Patient does not have consistent support to assist with appropriate and safe care at home Level of care concerns, Limited access to caregiver, Cognitive Deficits, Memory Deficits, and Lacks knowledge of community resource:   Clinical Social Work Goal(s):  Over the next 90 days, patient and family will work with care management related to assisted living placement Licensed Clinical Social Worker will collaborate with son to help with identified facilities  and assist patient as needed to understand facility selection process Over the next 45 days, patient and/or designated Geophysical data processor will apply for SPECIAL ASSISTANCE Medicaid and select a facility from the list provided or be willing to take the first available facility Interventions completed by LCSW: Assessed needs and provided education on level of care and facility placement process Collaborated with son to take forms to primary care provider for completion [place]) Patient interviewed and appropriate assessments performed Discussed plans with patient for ongoing care management follow up and provided patient with direct contact information for care management team Advised patient to apply for Medicaid and research/review facility choices Provided education to patient/caregiver regarding level of care options. Patient Goals/Self-Care Activities Over the next 45 days, patient will:  apply for Medicaid, seek preferred ALF/memory care settings Follow Up Plan: Telephone follow up appointment with care management team member scheduled for: 12/06/21

## 2021-10-28 ENCOUNTER — Telehealth: Payer: Self-pay | Admitting: *Deleted

## 2021-10-28 NOTE — Telephone Encounter (Signed)
   Telephone encounter was:  Unsuccessful.  10/28/2021 Name: Kaylee Li MRN: [id number] DOB: 1930/04/22  Unsuccessful outbound call made today to assist with:   placement information   Outreach Attempt:  \  A HIPAA compliant voice message was left requesting a return call.  Instructed patient to call back at   Instructed patient to call back at [phone]  at their earliest convenience. .  [place], Care Management  [phone] [address] , [place] [address] Email : [email or web address]

## 2021-10-29 ENCOUNTER — Telehealth: Payer: Self-pay | Admitting: *Deleted

## 2021-10-29 NOTE — Telephone Encounter (Signed)
   Telephone encounter was:  Successful.  10/29/2021 Name: Kaylee Li MRN: [id number] DOB: Sep 09, 1930  Kaylee Li is a 87 y.o. year old female who is a primary care patient of Janith Lima, MD . The community resource team was consulted for assistance with Talked with son and he asked I also e-mail him the information and that LCSW reach out next week as he is ready to get her placement   Care guide performed the following interventions: Patient provided with information about care guide support team and interviewed to confirm resource needs Follow up call placed to the patient to discuss status of referral.  Follow Up Plan:  no further followup required  [place], Care Management  [phone] [address] , [place] [address] Email : [email or web address]

## 2021-10-29 NOTE — Telephone Encounter (Signed)
   Telephone encounter was:  Unsuccessful.  10/29/2021 Name: Kaylee Li MRN: [id number] DOB: 01-Sep-1930  Unsuccessful outbound call made today to assist with:   legal info, memory care  Outreach Attempt:  2nd Attempt  A HIPAA compliant voice message was left requesting a return call.  Instructed patient to call back at   Instructed patient to call back at [phone]  at their earliest convenience. .  [place], Care Management  [phone] [address] , [place] [address] Email : [email or web address]

## 2021-12-06 ENCOUNTER — Ambulatory Visit (INDEPENDENT_AMBULATORY_CARE_PROVIDER_SITE_OTHER): Payer: Medicare HMO | Admitting: *Deleted

## 2021-12-06 DIAGNOSIS — F0280 Dementia in other diseases classified elsewhere without behavioral disturbance: Secondary | ICD-10-CM

## 2021-12-06 DIAGNOSIS — J449 Chronic obstructive pulmonary disease, unspecified: Secondary | ICD-10-CM

## 2021-12-06 DIAGNOSIS — G301 Alzheimer's disease with late onset: Secondary | ICD-10-CM

## 2021-12-08 NOTE — Chronic Care Management (AMB) (Signed)
Chronic Care Management    Clinical Social Work Note  12/08/2021 Name: Kaylee Li MRN: [id number] DOB: Feb 28, 1930  Kaylee Li is a 87 y.o. year old female who is a primary care patient of Janith Lima, MD. The CCM team was consulted to assist the patient with chronic disease management and/or care coordination needs related to: Level of Care Concerns and Caregiver Stress.   Engaged with patient by telephone for follow up visit in response to provider referral for social work chronic care management and care coordination services.   Consent to Services:  The patient was given information about Chronic Care Management services, agreed to services, and gave verbal consent prior to initiation of services.  Please see initial visit note for detailed documentation.   Patient agreed to services and consent obtained.   Assessment: Review of patient past medical history, allergies, medications, and health status, including review of relevant consultants reports was performed today as part of a comprehensive evaluation and provision of chronic care management and care coordination services.     SDOH (Social Determinants of Health) assessments and interventions performed:    Advanced Directives Status: See Care Plan for related entries.  CCM Care Plan  No Known Allergies  Outpatient Encounter Medications as of 12/06/2021  Medication Sig   acetaminophen (TYLENOL) 500 MG tablet Take 1,000 mg by mouth 3 (three) times daily. Reported on 03/21/2016 (Patient not taking: Reported on 10/15/2021)   aspirin 81 MG tablet Take 81 mg by mouth daily. (Patient not taking: Reported on 10/15/2021)   Cholecalciferol 50 MCG (2000 UT) TABS Take 1 tablet (2,000 Units total) by mouth daily. (Patient not taking: Reported on 10/15/2021)   desonide (DESOWEN) 0.05 % lotion Apply topically 2 (two) times daily. (Patient not taking: Reported on 10/15/2021)   ketoconazole (NIZORAL) 2 % cream Apply 1 application  topically 2 (two) times daily. (Patient not taking: Reported on 10/15/2021)   losartan (COZAAR) 50 MG tablet Take 1 tablet (50 mg total) by mouth daily. (Patient not taking: Reported on 10/15/2021)   meloxicam (MOBIC) 7.5 MG tablet Take 1 tablet (7.5 mg total) by mouth daily. (Patient not taking: Reported on 10/15/2021)   No facility-administered encounter medications on file as of 12/06/2021.    Patient Active Problem List   Diagnosis Date Noted   Vitamin D deficiency disease 12/09/2020   Moderate protein-calorie malnutrition ([place]) 12/09/2020   Encounter for general adult medical examination with abnormal findings 12/08/2020   Needs flu shot 12/08/2020   Late onset Alzheimer's dementia without behavioral disturbance ([place]) 12/08/2020   Need for vaccination for pneumococcus 12/08/2020   Stage 3b chronic kidney disease ([place]) 12/08/2020   Acute seborrheic dermatitis 06/02/2014   Routine general medical examination at a health care facility 06/02/2014   Vitamin D deficiency 06/02/2014   B12 deficiency 06/02/2014   Cough 01/12/2012   Other screening mammogram 01/12/2012   Tobacco abuse 01/12/2012   COPD with chronic bronchitis ([place]) 10/29/2008   Hyperlipidemia with target LDL less than 130 10/31/2007   Essential hypertension 10/31/2007   DJD (degenerative joint disease), multiple sites 10/31/2007   Osteoporosis 10/31/2007    Conditions to be addressed/monitored: Dementia; Limited social support, Level of care concerns, and Limited access to caregiver  Care Plan : LCSW Plan of Care  Updates made by Deirdre Peer, LCSW since 12/08/2021 12:00 AM     Problem: Long-Term Care Planning      Long-Range Goal: Provide support, resources and direction for seeking placement  Start Date: 10/25/2021  Expected End Date: 01/24/2022  This Visit's Progress: On track  Recent Progress: On track  Priority: High  Note:   Current Barriers:   Patient with Dementia has Care Coordination Needs  related to facility placement for  memory care/ALF   Dementia advanced- lives with son Patient is no longer able safely have ADL and/or IADL needs met in home environment Knowledge deficits, education and support needs related to level of care concerns Patient/ Family is unable to independently coordinate transition to needed level of care  Patient does not have consistent support to assist with appropriate and safe care at home Level of care concerns, Limited access to caregiver, Cognitive Deficits, Memory Deficits, and Lacks knowledge of community resource:   Clinical Social Work Goal(s):  Over the next 90 days, patient and family will work with care management related to assisted living placement Licensed Clinical Social Worker will collaborate with son to help with identified facilities and assist patient as needed to understand facility selection process Over the next 45 days, patient and/or designated personal representative will apply for SPECIAL ASSISTANCE Medicaid and select a facility from the list provided or be willing to take the first available facility Interventions completed by LCSW: Son reports he applied for [employer] and is awaiting word- she was approved for their nutrition/food assistance program. Son to contact CSW once Medicaid is determined to proceed further with plans for seeking placement. Assessed needs and provided education on level of care and facility placement process Collaborated with son to take forms to primary care provider for completion [facility]) Patient interviewed and appropriate assessments performed Discussed plans with patient for ongoing care management follow up and provided patient with direct contact information for care management team Advised patient to apply for Medicaid and research/review facility choices Provided education to patient/caregiver regarding level of care options. Patient Goals/Self-Care Activities Over the next 45 days, patient  will:  inquire with Medicaid regarding application you submitted and call me once determination is received seek preferred ALF/memory care settings Follow Up Plan: Telephone follow up appointment with care management team member scheduled for: 01/14/22                 Follow Up Plan: Appointment scheduled for SW follow up with client by phone on: 01/14/21      Eduard Clos MSW, [place] Licensed Clinical Social Worker [place] [phone]

## 2021-12-08 NOTE — Patient Instructions (Signed)
Visit Information  Thank you for taking time to visit with me today. Please don't hesitate to contact me if I can be of assistance to you before our next scheduled telephone appointment.  Following are the goals we discussed today:  (Copy and paste patient goals from clinical care plan here)  Our next appointment is by telephone on 01/14/21     Please call the care guide team at [phone] if you need to cancel or reschedule your appointment.   If you are experiencing a Mental Health or [place] or need someone to talk to, please call the Suicide and Crisis Lifeline: 988 call 911   The patient verbalized understanding of instructions, educational materials, and care plan provided today and declined offer to receive copy of patient instructions, educational materials, and care plan.   Eduard Clos MSW, LCSW Licensed Clinical Social Worker [facility] [place] [phone]

## 2021-12-25 DIAGNOSIS — J449 Chronic obstructive pulmonary disease, unspecified: Secondary | ICD-10-CM

## 2021-12-25 DIAGNOSIS — F0280 Dementia in other diseases classified elsewhere without behavioral disturbance: Secondary | ICD-10-CM

## 2021-12-25 DIAGNOSIS — G301 Alzheimer's disease with late onset: Secondary | ICD-10-CM

## 2022-01-14 ENCOUNTER — Telehealth: Payer: Medicare HMO

## 2022-01-17 ENCOUNTER — Ambulatory Visit: Payer: Medicare HMO | Admitting: Licensed Clinical Social Worker

## 2022-01-17 NOTE — Chronic Care Management (AMB) (Signed)
° °   Clinical Social Work  Care Management   Phone Outreach    01/17/2022 Name: Kaylee Li MRN: [id number] DOB: Jun 25, 1930  Kaylee Li is a 88 y.o. year old female who is a primary care patient of Janith Lima, MD .   Reason for referral: Level of Care Concerns.    F/U phone call today to assess needs, progress and barriers with care plan goals.   Patient's son [name] unable to keep phone appointment today and requested to reschedule. Would like to talk Tuesday or Wed 1/24 or 1/25  Plan: LCSW will attempt to call back on the days requested. If unable will have care guide reschedule appointment  Review of patient status, including review of consultants reports, relevant laboratory and other test results, and collaboration with appropriate care team members and the patient's provider was performed as part of comprehensive patient evaluation and provision of care management services.    Casimer Lanius, LCSW Licensed Clinical Social Worker [place]  [place]  [phone]

## 2022-01-17 NOTE — Patient Instructions (Signed)
° °  It was a pleasure speaking with you today. I am sorry you were unable to keep your phone appointment today.   I will call you in 1 to 2 days  Casimer Lanius, LCSW Licensed Clinical Social Worker [place]  [phone]

## 2022-01-19 ENCOUNTER — Ambulatory Visit (INDEPENDENT_AMBULATORY_CARE_PROVIDER_SITE_OTHER): Payer: Medicare HMO | Admitting: Licensed Clinical Social Worker

## 2022-01-19 DIAGNOSIS — F0280 Dementia in other diseases classified elsewhere without behavioral disturbance: Secondary | ICD-10-CM

## 2022-01-19 DIAGNOSIS — I10 Essential (primary) hypertension: Secondary | ICD-10-CM

## 2022-01-19 NOTE — Chronic Care Management (AMB) (Signed)
Chronic Care Management   Clinical Social Work Note  01/19/2022 Name: Kaylee Li MRN: [id number] DOB: 02/24/1930  Kaylee Li is a 88 y.o. year old female who is a primary care patient of Janith Lima, MD. The CCM team was consulted to assist the patient with chronic disease management and/or care coordination needs related to: Level of Care Concerns and Caregiver Stress.   Collaboration with patient's son  for follow up visit in response to provider referral for social work chronic care management and care coordination services.   Consent to Services:  The patient was given information about Chronic Care Management services, agreed to services, and gave verbal consent prior to initiation of services.  Please see initial visit note for detailed documentation.   Patient agreed to services and consent obtained.   Summary: Assessed patient's current treatment, progress, coping skills, support system and barriers to care.  Patient's son Christia Reading provided all information during this encounter. He continues to experience difficulty with completing the facility placement process. Understands what he needs to do just needs to move forward with completing new Medicaid application and visiting facilities..  See Care Plan below for interventions and patient self-care actives.  Recommendation: Patient may benefit from, and is in agreement to work on [facility] application which is the first step to placement process.   Follow up Plan: Patient's caregiver would like continued follow-up from CCM LCSW .  per caregiver's request CCM LCSW will follow up in 30 days.  They will call the office if needed prior to next encounter.   Assessment: Review of patient past medical history, allergies, medications, and health status, including review of relevant consultants reports was performed today as part of a comprehensive evaluation and provision of chronic care management and care coordination services.     SDOH  (Social Determinants of Health) assessments and interventions performed:    Advanced Directives Status: See [employer] application for related entries.  CCM Care Plan   Conditions to be addressed/monitored: Dementia; Level of care concerns  Care Plan : LCSW Plan of Care  Updates made by Maurine Cane, LCSW since 01/19/2022 12:00 AM     Problem: Long-Term Care Planning      Long-Range Goal: Provide support, resources and direction for seeking placement   Start Date: 10/25/2021  Expected End Date: 01/24/2022  This Visit's Progress: Not on track  Recent Progress: On track  Priority: High  Note:   Current Barriers:   Patient with Dementia has Care Coordination Needs related to facility placement for  memory care/ALF   Dementia advanced- lives with son Patient is no longer able safely have ADL and/or IADL needs met in home environment Patient/ Family is unable to independently coordinate transition to needed level of care  Patient does not have consistent support to assist with appropriate and safe care at home Level of care concerns, Limited access to caregiver, Cognitive Deficits, Memory Deficits, and Lacks knowledge of community resource:   Clinical Social Work Goal(s):  Over the next 90 days, patient and family will work with care management related to assisted living placement Licensed Clinical Social Worker will collaborate with son to help with identified facilities and assist patient as needed to understand facility selection process Over the next 45 days, patient and/or designated personal representative will apply for SPECIAL ASSISTANCE Medicaid and select a facility from the list provided or be willing to take the first available facility  Interventions: 1:1 collaboration with primary care provider regarding development and  update of comprehensive plan of care as evidenced by provider attestation and co-signature Inter-disciplinary care team collaboration (see longitudinal plan  of care) Evaluation of current treatment plan related to  self management and patient's adherence to plan as established by provider Review resources, discussed options and provided patient information about  Department of Social Services ( applied for the wrong Medicaid and needs to reapply )  Level of Care Concerns in a patient with Dementia:  (Status: Goal on track: NO.) Current level of care: home with other family or significant other(s): family member: son Evaluation of patient safety in current living environment Active listening / Reflection utilized  Caregiver stress acknowledged  Reviewed process   Patient Self-Care Activities: Complete long-term care Medicaid Select facilities from the list provided   Call to check on availability and visit facilities Go visit facilities     Casimer Lanius, [place] Licensed Clinical Social Worker Warehouse manager Primary [facility] [place]  [phone]

## 2022-01-19 NOTE — Patient Instructions (Signed)
Visit Information  Thank you for taking time to visit with me today. Please don't hesitate to contact me if I can be of assistance to you before our next scheduled telephone appointment.  Following are the goals we discussed today: facility placement Patient Self-Care Activities: Complete long-term care Medicaid Select facilities from the list provided   Call to check on availability and visit facilities Go visit facilities  Our next appointment is by telephone on Feb 27th at 3:00  Please call the care guide team at [phone] if you need to cancel or reschedule your appointment.   If you are experiencing a Mental Health or [place] or need someone to talk to, please call the [place] National Suicide Prevention Lifeline: (856)273-3319 or TTY: (778) 306-9820 TTY 512-836-6796) to talk to a trained counselor call 1-800-273-TALK (toll free, 24 hour hotline) go to [facility] [place] [phone])   The patient verbalized understanding of instructions, educational materials, and care plan provided today and declined offer to receive copy of patient instructions, educational materials, and care plan. (Son)  Casimer Lanius, LCSW Licensed Clinical Social Worker [facility]  [place]  [phone]

## 2022-01-25 DIAGNOSIS — I10 Essential (primary) hypertension: Secondary | ICD-10-CM

## 2022-01-25 DIAGNOSIS — F0280 Dementia in other diseases classified elsewhere without behavioral disturbance: Secondary | ICD-10-CM

## 2022-01-25 DIAGNOSIS — G301 Alzheimer's disease with late onset: Secondary | ICD-10-CM

## 2022-01-31 ENCOUNTER — Telehealth: Payer: Self-pay | Admitting: Internal Medicine

## 2022-01-31 NOTE — Telephone Encounter (Signed)
Pt's son has emailed a copy of [place]. It was printed and placed in MD mailbox.   Please contact son, once completed. He is requesting it to be expedited, as pt's Alzheimer's diagnosis has gotten worse.   Pt notified of turn around time. Please call [phone]

## 2022-02-01 NOTE — Telephone Encounter (Signed)
Spoke with Kaylee Li (Pt son) and advised him that she may need an appt since she hasn't been seen since 12/08/20 by Dr. Ronnald Ramp for [facility] forms to be completed. Pt saw Dr. Sharlet Salina 10/15/21.   Dr. Ronnald Ramp first appt is at the end of March and the son needs these forms for placement.  Please advise the first available appt for Dr. Ronnald Ramp for this if its needed.

## 2022-02-04 NOTE — Telephone Encounter (Signed)
Pt's son called asking for the status of FL2 form as he was told it would be filled and completed that the pt did not need an appointment for this.  Pt last seen pcp on 12/08/2020. FL2 is needing to be completed by 02/23/2022. I told pts son pt has to be seen by PCP.  Pt son can be reached at [phone].

## 2022-02-07 NOTE — Telephone Encounter (Signed)
Pt has been scheduled for Th 2/23 @ 9.20am for follow up/FL2 completion.  I informed him that I would completed FL2 by end of day Friday 2/24 for him to meet the deadline.

## 2022-02-07 NOTE — Telephone Encounter (Signed)
Noted. FL2 will be completed after pt is seen by PCP.

## 2022-02-07 NOTE — Telephone Encounter (Signed)
Pts son [name] requesting a c/b to discuss FL2 forms   Phone [phone]  *see below*

## 2022-02-17 ENCOUNTER — Other Ambulatory Visit: Payer: Self-pay

## 2022-02-17 ENCOUNTER — Encounter: Payer: Self-pay | Admitting: Internal Medicine

## 2022-02-17 ENCOUNTER — Ambulatory Visit (INDEPENDENT_AMBULATORY_CARE_PROVIDER_SITE_OTHER): Payer: Medicare HMO | Admitting: Internal Medicine

## 2022-02-17 ENCOUNTER — Ambulatory Visit (INDEPENDENT_AMBULATORY_CARE_PROVIDER_SITE_OTHER): Payer: Medicare HMO

## 2022-02-17 VITALS — BP 134/84 | HR 96 | Temp 97.8°F | Ht 66.0 in | Wt 104.0 lb

## 2022-02-17 DIAGNOSIS — J439 Emphysema, unspecified: Secondary | ICD-10-CM | POA: Diagnosis not present

## 2022-02-17 DIAGNOSIS — R053 Chronic cough: Secondary | ICD-10-CM

## 2022-02-17 DIAGNOSIS — R918 Other nonspecific abnormal finding of lung field: Secondary | ICD-10-CM

## 2022-02-17 DIAGNOSIS — E538 Deficiency of other specified B group vitamins: Secondary | ICD-10-CM | POA: Diagnosis not present

## 2022-02-17 DIAGNOSIS — Z23 Encounter for immunization: Secondary | ICD-10-CM

## 2022-02-17 DIAGNOSIS — I10 Essential (primary) hypertension: Secondary | ICD-10-CM | POA: Diagnosis not present

## 2022-02-17 DIAGNOSIS — N1832 Chronic kidney disease, stage 3b: Secondary | ICD-10-CM | POA: Diagnosis not present

## 2022-02-17 DIAGNOSIS — Z0001 Encounter for general adult medical examination with abnormal findings: Secondary | ICD-10-CM | POA: Diagnosis not present

## 2022-02-17 LAB — BASIC METABOLIC PANEL
BUN: 31 mg/dL — ABNORMAL HIGH (ref 6–23)
CO2: 29 mEq/L (ref 19–32)
Calcium: 9.4 mg/dL (ref 8.4–10.5)
Chloride: 100 mEq/L (ref 96–112)
Creatinine, Ser: 1.41 mg/dL — ABNORMAL HIGH (ref 0.40–1.20)
GFR: 32.99 mL/min — ABNORMAL LOW (ref >60.00)
Glucose, Bld: 77 mg/dL (ref 70–99)
Potassium: 4.9 mEq/L (ref 3.5–5.1)
Sodium: 136 mEq/L (ref 135–145)

## 2022-02-17 LAB — CBC WITH DIFFERENTIAL/PLATELET
Basophils Absolute: 0 10*3/uL (ref 0.0–0.1)
Basophils Relative: 0.4 % (ref 0.0–3.0)
Eosinophils Absolute: 0.1 10*3/uL (ref 0.0–0.7)
Eosinophils Relative: 1 % (ref 0.0–5.0)
HCT: 35.9 % — ABNORMAL LOW (ref 36.0–46.0)
Hemoglobin: 11.5 g/dL — ABNORMAL LOW (ref 12.0–15.0)
Lymphocytes Relative: 13.6 % (ref 12.0–46.0)
Lymphs Abs: 1.3 10*3/uL (ref 0.7–4.0)
MCHC: 32 g/dL (ref 30.0–36.0)
MCV: 82.6 fl (ref 78.0–100.0)
Monocytes Absolute: 1.3 10*3/uL — ABNORMAL HIGH (ref 0.1–1.0)
Monocytes Relative: 14.3 % — ABNORMAL HIGH (ref 3.0–12.0)
Neutro Abs: 6.5 10*3/uL (ref 1.4–7.7)
Neutrophils Relative %: 70.7 % (ref 43.0–77.0)
Platelets: 402 10*3/uL — ABNORMAL HIGH (ref 150.0–400.0)
RBC: 4.35 Mil/uL (ref 3.87–5.11)
RDW: 14.7 % (ref 11.5–15.5)
WBC: 9.2 10*3/uL (ref 4.0–10.5)

## 2022-02-17 LAB — FOLATE: Folate: 11.4 ng/mL (ref >5.9)

## 2022-02-17 MED ORDER — CYANOCOBALAMIN 1000 MCG/ML IJ SOLN
1000.0000 ug | Freq: Once | INTRAMUSCULAR | Status: AC
  Administered 2022-02-17: 1000 ug via INTRAMUSCULAR

## 2022-02-17 NOTE — Telephone Encounter (Signed)
Form was completed, signed and given to pt and her son during todays visit with PCP.

## 2022-02-17 NOTE — Progress Notes (Signed)
Subjective:  Patient ID: Kaylee Li, female    DOB: July 23, 1930  Age: 88 y.o. MRN: [id number]  CC: Annual Exam, Hypertension, Cough, and Anemia  This visit occurred during the SARS-CoV-2 public health emergency.  Safety protocols were in place, including screening questions prior to the visit, additional usage of staff PPE, and extensive cleaning of exam room while observing appropriate contact time as indicated for disinfecting solutions.    HPI Kaylee Li presents for a CPX and f/up -   She returns with her son.  He complains that her confusion is worsening and he is thinking about placing her in a skilled nursing facility.  She complains of chronic nonproductive cough, poor appetite, and musculoskeletal pain.  She occasionally takes ibuprofen.  Outpatient Medications Prior to Visit  Medication Sig Dispense Refill   acetaminophen (TYLENOL) 500 MG tablet Take 1,000 mg by mouth 3 (three) times daily. Reported on 03/21/2016     Cholecalciferol 50 MCG (2000 UT) TABS Take 1 tablet (2,000 Units total) by mouth daily. 90 tablet 1   desonide (DESOWEN) 0.05 % lotion Apply topically 2 (two) times daily. 59 mL 5   ketoconazole (NIZORAL) 2 % cream Apply 1 application topically 2 (two) times daily. 60 g 5   aspirin 81 MG tablet Take 81 mg by mouth daily.     losartan (COZAAR) 50 MG tablet Take 1 tablet (50 mg total) by mouth daily. 90 tablet 1   meloxicam (MOBIC) 7.5 MG tablet Take 1 tablet (7.5 mg total) by mouth daily. 90 tablet 1   No facility-administered medications prior to visit.    ROS Review of Systems  Constitutional:  Positive for appetite change and fatigue. Negative for diaphoresis, fever and unexpected weight change.  HENT: Negative.  Negative for trouble swallowing.   Eyes: Negative.   Respiratory:  Positive for cough. Negative for chest tightness, shortness of breath and wheezing.   Cardiovascular:  Negative for chest pain, palpitations and leg swelling.  Gastrointestinal:   Negative for abdominal pain, constipation, diarrhea, nausea and vomiting.  Genitourinary: Negative.  Negative for difficulty urinating.  Musculoskeletal:  Positive for arthralgias. Negative for myalgias.  Skin: Negative.   Neurological: Negative.   Hematological:  Negative for adenopathy. Does not bruise/bleed easily.  Psychiatric/Behavioral: Negative.     Objective:  BP 134/84 (BP Location: Right Arm, Patient Position: Sitting, Cuff Size: Large)    Pulse 96    Temp 97.8 F (36.6 C) (Oral)    Ht 5\' 6"  (1.676 m)    Wt 104 lb (47.2 kg)    SpO2 97%    BMI 16.79 kg/m   BP Readings from Last 3 Encounters:  02/17/22 134/84  10/15/21 (!) 144/70  12/08/20 (!) 146/96    Wt Readings from Last 3 Encounters:  02/17/22 104 lb (47.2 kg)  10/15/21 100 lb 6.4 oz (45.5 kg)  12/08/20 96 lb (43.5 kg)    Physical Exam Vitals reviewed.  Constitutional:      General: She is not in acute distress.    Appearance: She is cachectic. She is ill-appearing. She is not toxic-appearing or diaphoretic.  HENT:     Mouth/Throat:     Mouth: Mucous membranes are moist. Mucous membranes are pale.  Eyes:     General: No scleral icterus.    Conjunctiva/sclera: Conjunctivae normal.  Cardiovascular:     Rate and Rhythm: Normal rate and regular rhythm.     Heart sounds: No murmur heard. Pulmonary:     Effort:  Pulmonary effort is normal.     Breath sounds: Examination of the right-upper field reveals decreased breath sounds. Examination of the left-upper field reveals decreased breath sounds. Examination of the right-middle field reveals decreased breath sounds. Examination of the left-middle field reveals decreased breath sounds. Examination of the right-lower field reveals decreased breath sounds. Examination of the left-lower field reveals decreased breath sounds. Decreased breath sounds present. No wheezing, rhonchi or rales.  Abdominal:     General: Abdomen is flat.     Palpations: There is no mass.      Tenderness: There is no abdominal tenderness. There is no guarding.     Hernia: No hernia is present.  Musculoskeletal:        General: Normal range of motion.     Cervical back: Neck supple.     Right lower leg: No edema.     Left lower leg: No edema.  Lymphadenopathy:     Cervical: No cervical adenopathy.  Skin:    General: Skin is warm and dry.     Coloration: Skin is pale.  Neurological:     General: No focal deficit present.     Mental Status: She is alert. Mental status is at baseline.    Lab Results  Component Value Date   WBC 9.2 02/17/2022   HGB 11.5 (L) 02/17/2022   HCT 35.9 (L) 02/17/2022   PLT 402.0 (H) 02/17/2022   GLUCOSE 77 02/17/2022   CHOL 195 12/08/2020   TRIG 79.0 12/08/2020   HDL 95.50 12/08/2020   LDLDIRECT 112.0 12/18/2017   LDLCALC 84 12/08/2020   ALT 8 12/08/2020   AST 10 12/08/2020   NA 136 02/17/2022   K 4.9 02/17/2022   CL 100 02/17/2022   CREATININE 1.41 (H) 02/17/2022   BUN 31 (H) 02/17/2022   CO2 29 02/17/2022   TSH 2.05 12/08/2020   HGBA1C 5.6 07/02/2015    DG Lumbar Spine Complete  Result Date: 12/18/2017 CLINICAL DATA:  Lower back AND bilateral leg pain x intermittent x 6-7 mos, NKI. EXAM: LUMBAR SPINE - COMPLETE 4+ VIEW COMPARISON:  None. FINDINGS: Negative for fracture. Moderate narrowing of the L1-2 and L4-5 interspaces. Mild narrowing L2-3, L3-4. Grade 2 anterolisthesis L4-5. No convincing pars defect identified. Extensive aortoiliac arterial calcifications without suggestion of aneurysm. IMPRESSION: 1. Negative for fracture or other acute bone abnormality. 2. Multilevel degenerative disc disease with grade 2 anterolisthesis L4-5. 3.  Aortic Atherosclerosis (ICD10-170.0) Electronically Signed   By: Lucrezia Europe M.D.   On: 12/18/2017 09:24   DG Chest 2 View  Result Date: 02/18/2022 CLINICAL DATA:  Chronic cough. EXAM: CHEST - 2 VIEW COMPARISON:  Chest radiograph 12/08/2020 FINDINGS: Stable cardiac and mediastinal contours. Aortic  atherosclerosis. Interval development of nodule within the left mid lung. Probable right apical nodular opacity. Emphysematous change. Thoracic spine degenerative changes. IMPRESSION: New left mid lung nodule and possible lung nodule right lung apex. Recommend further evaluation with chest CT. These results will be called to the ordering clinician or representative by the Radiologist Assistant, and communication documented in the PACS or [employer]. Electronically Signed   By: Lovey Newcomer M.D.   On: 02/18/2022 15:41     Assessment & Plan:   Alaine was seen today for annual exam, hypertension, cough and anemia.  Diagnoses and all orders for this visit:  Essential hypertension- Her blood pressure is adequately well controlled. -     Basic metabolic panel; Future -     Basic metabolic panel  Stage  3b chronic kidney disease ([place])- Her renal function has declined.  I have asked her to stop taking ibuprofen. -     Basic metabolic panel; Future -     Basic metabolic panel  Encounter for general adult medical examination with abnormal findings- Exam completed, labs reviewed, vaccines reviewed and updated, no cancer screenings indicated, patient education was given.  B12 deficiency- I have asked her to be more compliant with parenteral B12 replacement therapy. -     CBC with Differential/Platelet; Future -     Folate; Future -     Cancel: Vitamin B12; Future -     cyanocobalamin ((VITAMIN B-12)) injection 1,000 mcg -     Folate -     CBC with Differential/Platelet  Chronic cough- She has new nodules.  I have recommended that she undergo a CT without contrast in light of for a GFR of 20. -     DG Chest 2 View; Future  Abnormal chest x-ray with multiple lung nodules -     CT Chest Wo Contrast; Future   I have discontinued Lashanti F. Howson's aspirin, meloxicam, and losartan. I am also having her maintain her acetaminophen, ketoconazole, desonide, and Cholecalciferol. We administered  cyanocobalamin.  Meds ordered this encounter  Medications   cyanocobalamin ((VITAMIN B-12)) injection 1,000 mcg     Follow-up: Return in about 6 months (around 08/17/2022).  Scarlette Calico, MD

## 2022-02-17 NOTE — Patient Instructions (Signed)

## 2022-02-19 ENCOUNTER — Encounter: Payer: Self-pay | Admitting: Internal Medicine

## 2022-02-19 MED ORDER — SHINGRIX 50 MCG/0.5ML IM SUSR: 0.5000 mL | Freq: Once | INTRAMUSCULAR | 1 refills | Status: AC

## 2022-02-21 ENCOUNTER — Telehealth: Payer: Self-pay

## 2022-02-21 ENCOUNTER — Ambulatory Visit (INDEPENDENT_AMBULATORY_CARE_PROVIDER_SITE_OTHER): Payer: Medicare HMO | Admitting: Licensed Clinical Social Worker

## 2022-02-21 DIAGNOSIS — N1832 Chronic kidney disease, stage 3b: Secondary | ICD-10-CM

## 2022-02-21 DIAGNOSIS — Z636 Dependent relative needing care at home: Secondary | ICD-10-CM

## 2022-02-21 DIAGNOSIS — G301 Alzheimer's disease with late onset: Secondary | ICD-10-CM

## 2022-02-21 NOTE — Patient Instructions (Addendum)
Visit Information  Thank you for taking time to visit with me today. Please don't hesitate to contact me if I can be of assistance to you before our next scheduled telephone appointment.  Following are the goals we discussed today: Level of Care concerns  Patient Self-Care Activities: Continue to work with [place] for Special Assistance Call to check on availability and visit facilities in [place] and [place] next appointment is by telephone on March 13th at 9:30  Please call the care guide team at [phone] if you need to cancel or reschedule your appointment.   If you are experiencing a Mental Health or [place] or need someone to talk to, please call 1-800-273-TALK (toll free, 24 hour hotline)   The patient verbalized understanding of instructions, educational materials, and care plan provided today and declined offer to receive copy of patient instructions, educational materials, and care plan.   Kaylee Lanius, LCSW Licensed Clinical Social Worker [facility]  [place]  [phone]

## 2022-02-21 NOTE — Telephone Encounter (Signed)
noted 

## 2022-02-21 NOTE — Chronic Care Management (AMB) (Signed)
Chronic Care Management   Clinical Social Work Note  02/21/2022 Name: Kaylee Li MRN: [id number] DOB: 19-Feb-1930  [name] is a 88 y.o. year old female who is a primary care patient of Janith Lima, MD. The CCM team was consulted to assist the patient with chronic disease management and/or care coordination needs related to: Level of Care Concerns for facility placement  Collaboration with patient's son  for follow up visit in response to provider referral for social work chronic care management and care coordination services.   Consent to Services:  The patient's son was given information about Chronic Care Management services, agreed to services, and gave verbal consent prior to initiation of services.  Please see initial visit note for detailed documentation.   Patient's son agreed to services and consent obtained.   Summary: Patient's son Christia Reading provided all information during this encounter. He is making progress with facility placement for patient. PCP completed FL2. Christia Reading has provided all needed information to Department of Social Services for [employer] application for subsidy payment .  See Care Plan below for interventions and patient self-care actives.  Recommendation: Patient may benefit from, and son is in agreement to follow up  and visit facilities in [place] and [place] that be believes will meet patient's needs..   Follow up Plan: Patient's caregiver would like continued follow-up from CCM LCSW .  per caregiver's request CCM LCSW will follow up in 2  weeks.  They will call the office if needed prior to next encounter.   Assessment: Review of patient past medical history, allergies, medications, and health status, including review of relevant consultants reports was performed today as part of a comprehensive evaluation and provision of chronic care management and care coordination services.     SDOH (Social Determinants of Health) assessments and  interventions performed:    Advanced Directives Status: Not addressed in this encounter.  CCM Care Plan  Conditions to be addressed/monitored: Dementia; Level of care concerns  Care Plan : LCSW Plan of Care  Updates made by Maurine Cane, LCSW since 02/21/2022 12:00 AM     Problem: Long-Term Care Planning      Long-Range Goal: Provide support, resources and direction for seeking placement   Start Date: 10/25/2021  This Visit's Progress: On track  Recent Progress: Not on track  Priority: High  Note:   Current Barriers:   Patient with Dementia has Care Coordination Needs related to facility placement for  memory care/ALF   Patient is no longer able safely have ADL and/or IADL needs met in home environment Patient/ Family is unable to independently coordinate transition to needed level of care  Patient does not have consistent support to assist with appropriate and safe care at home  Clinical Social Work Goal(s):  Over the next 90 days, patient and family will work with care management related to assisted living placement Licensed Clinical Social Worker will collaborate with son to help with identified facilities and assist patient as needed to understand facility selection process Over the next 45 days, patient and/or designated personal representative will apply for SPECIAL ASSISTANCE Medicaid and select a facility from the list provided or be willing to take the first available facility  Interventions: 1:1 collaboration with primary care provider regarding development and update of comprehensive plan of care as evidenced by provider attestation and co-signature Inter-disciplinary care team collaboration (see longitudinal plan of care) Evaluation of current treatment plan related to  self management and patient's adherence  to plan as established by provider  Level of Care Concerns in a patient with Dementia:  (Status: Goal on Track (progressing): YES.) Current level of care:  home with other family or significant other(s): family member: son Evaluation of patient safety in current living environment Active listening / Reflection utilized  Caregiver stress acknowledged   Patient Self-Care Activities: Continue to work with Department of Orthoptist for [employer] to check on availability and visit facilities in [place] and [place], [place] Licensed Clinical Social Worker Warehouse manager Primary Care [place]  [phone]

## 2022-02-21 NOTE — Telephone Encounter (Signed)
Pt son called back and I relayed the result note to him. Also advised him to be on the look out for CT to be calling to get her scheduled.   FYI

## 2022-02-22 ENCOUNTER — Telehealth: Payer: Self-pay

## 2022-02-22 DIAGNOSIS — G301 Alzheimer's disease with late onset: Secondary | ICD-10-CM

## 2022-02-22 DIAGNOSIS — F0280 Dementia in other diseases classified elsewhere without behavioral disturbance: Secondary | ICD-10-CM

## 2022-02-22 NOTE — Telephone Encounter (Signed)
Pt son is calling stating the facility that they are trying to admit pt in is requesting the last 2 office notes.  The facility is:  Attn: [facility] [place], [place] Fax [phone] Phone  Also son is requesting a son of the forms as they are looking for the best fit for the pt.

## 2022-02-23 NOTE — Telephone Encounter (Signed)
Last 2 OV notes sent to [facility] attn to fax number below.  ?

## 2022-03-07 ENCOUNTER — Encounter: Payer: Self-pay | Admitting: Licensed Clinical Social Worker

## 2022-03-07 ENCOUNTER — Ambulatory Visit (INDEPENDENT_AMBULATORY_CARE_PROVIDER_SITE_OTHER): Payer: Medicare HMO | Admitting: Licensed Clinical Social Worker

## 2022-03-07 DIAGNOSIS — F0280 Dementia in other diseases classified elsewhere without behavioral disturbance: Secondary | ICD-10-CM

## 2022-03-07 DIAGNOSIS — Z636 Dependent relative needing care at home: Secondary | ICD-10-CM

## 2022-03-07 DIAGNOSIS — I10 Essential (primary) hypertension: Secondary | ICD-10-CM

## 2022-03-07 NOTE — Chronic Care Management (AMB) (Signed)
?Chronic Care Management  ? Clinical Social Work Note ? ?03/07/2022 ?Name: Kaylee Li MRN: [id number] DOB: [birth year] ? ?Kaylee Li is a 88 y.o. year old female who is a primary care patient of Janith Lima, MD. The CCM team was consulted to assist the patient with chronic disease management and/or care coordination needs related to: Caregiver Stress and placement .  ? ?Engaged with patient by telephone for follow up visit in response to provider referral for social work chronic care management and care coordination services.  ? ?Consent to Services:  ?The patient was given information about Chronic Care Management services, agreed to services, and gave verbal consent prior to initiation of services.  Please see initial visit note for detailed documentation.  ? ?Patient agreed to services and consent obtained.  ? ?Summary: Assessed patient's current treatment, progress, coping skills, support system and barriers to care.  Patient's Son Kaylee Li provided all information during this encounter. He is making progress with placement process and continues to work with the Department of Social Services during this process .  See Care Plan below for interventions and patient self-care actives. ? ?Follow up Plan:  ?Patient's caregiver does not desire continued follow-up by CCM LCSW. Will contact the office if needed ?Patient may benefit from and is in agreement for CCM LCSW to remain part of care team for the next 60 days.  If no needs are identified in the next 60 days, CCM LSCW will disconnect from the care team. ?  ?Assessment: Review of patient past medical history, allergies, medications, and health status, including review of relevant consultants reports was performed today as part of a comprehensive evaluation and provision of chronic care management and care coordination services.    ? ?SDOH (Social Determinants of Health) assessments and interventions performed:   ? ?Advanced Directives Status: Not addressed  in this encounter. ? ?CCM Care Plan ?Conditions to be addressed/monitored: Dementia;  ? ?Care Plan : LCSW Plan of Care  ?Updates made by Maurine Cane, LCSW since 03/07/2022 12:00 AM  ?  ? ?Problem: Long-Term Care Planning   ?  ? ?Long-Range Goal: Provide support, resources and direction for seeking placement   ?Start Date: 10/25/2021  ?This Visit's Progress: On track  ?Recent Progress: On track  ?Priority: High  ?Note:   ?Current Barriers:   ?Patient with Dementia has Care Coordination Needs related to facility placement for  memory care/ALF   ?Patient is no longer able safely have ADL and/or IADL needs met in home environment ?Patient/ Family is unable to independently coordinate transition to needed level of care  ?Patient does not have consistent support to assist with appropriate and safe care at home ? ?Clinical Social Work Goal(s):  ?Over the next 90 days, patient and family will work with care management related to assisted living placement ?Licensed Clinical Social Worker will collaborate with son to help with identified facilities and assist patient as needed to understand facility selection process ?Over the next 45 days, patient and/or designated personal representative will apply for SPECIAL ASSISTANCE Medicaid and select a facility from the list provided or be willing to take the first available facility ? ?Interventions: ?1:1 collaboration with primary care provider regarding development and update of comprehensive plan of care as evidenced by provider attestation and co-signature ?Inter-disciplinary care team collaboration (see longitudinal plan of care) ?Evaluation of current treatment plan related to  self management and patient's adherence to plan as established by provider ? ?Level of Care Concerns in  a patient with Dementia:  (Status: Goal on Track (progressing): YES.) ?Current level of care: home with other family or significant other(s): family member: son ?Evaluation of patient safety in  current living environment ?Active listening / Reflection utilized  ?Caregiver stress acknowledged  ? ?Patient Self-Care Activities: ?Continue to work with Department of Social Services for Special Assistance ?Call to check on availability and visit facilities in [place] and [place] ?  ?  ?Casimer Lanius, LCSW ?Licensed Clinical Social Worker [facility]  ?[place]  ?[phone]  ?

## 2022-03-07 NOTE — Patient Instructions (Signed)
Visit Information ? ?Thank you Christia Reading for taking time to talk with me today. Please don't hesitate to contact me if I can be of assistance to you before our next scheduled telephone appointment. ? ?Following are the goals we discussed today: Level of Care Needs ?Patient Self-Care Activities: ?Continue to work with Department of Social Services for Special Assistance ?Call to check on availability and visit facilities in [place] and [place] ? ?Please call the care guide team at [phone] if you need to cancel or reschedule your appointment.  ? ?If you are experiencing a Mental Health or [place] or need someone to talk to, please call 1-800-273-TALK (toll free, 24 hour hotline)  ? ?The patient's son verbalized understanding of instructions, educational materials, and care plan provided today and agreed to receive a mailed copy of patient instructions, educational materials, and care plan.  ?No follow up scheduled, per our conversation you do not desire continued follow up ?Per our conversation I will remain part of your care team for the next 60 days.  If no needs are identified in the next 60 days, I will disconnect from the care team. ? ?Casimer Lanius, LCSW ?Licensed Clinical Social Worker [facility]  ?[place]  ?[phone]  ?

## 2022-03-08 NOTE — Telephone Encounter (Signed)
Patient son Kaylee Li calling in ? ?Tim says he has spoken w/ Dept of SS & they advised him that a new FL2 form is needed for patient due to patient needing "assistant living w/ memory care" ? ?Would like cb from nurse to discuss.. says he is needing to get her into a facility asap bc he can no longer continue caring for patient by himself ? ?Please call [phone] ?

## 2022-03-09 NOTE — Telephone Encounter (Signed)
LVM for Tim to discuss and inquire about the FL2 form we completed 2 weeks ago and why that does not satisfy what is needed. ? ?

## 2022-03-11 NOTE — Telephone Encounter (Signed)
Tim calling back to explain that the facility stated that the pt is in need of Memory care due to the Alzheimer's dementia Dx ? ?Kaylee Li is requesting a call back [phone] ? ? ?

## 2022-03-11 NOTE — Telephone Encounter (Signed)
New NOTE NOT NEEDED ?

## 2022-03-14 NOTE — Telephone Encounter (Signed)
Pt informed that new FL2 has been completed. Placed at the front desk for pick up.  ?

## 2022-03-14 NOTE — Telephone Encounter (Signed)
Pts son Kaylee Li returning call regarding forms  ? ?Please call [phone] ?

## 2022-03-14 NOTE — Telephone Encounter (Signed)
Original given to pt ?Copy filed with [facility] ?

## 2022-03-22 ENCOUNTER — Inpatient Hospital Stay: Admission: RE | Admit: 2022-03-22 | Payer: Medicare HMO | Source: Ambulatory Visit

## 2022-03-22 DIAGNOSIS — I10 Essential (primary) hypertension: Secondary | ICD-10-CM | POA: Diagnosis not present

## 2022-03-22 DIAGNOSIS — J449 Chronic obstructive pulmonary disease, unspecified: Secondary | ICD-10-CM | POA: Diagnosis not present

## 2022-03-22 DIAGNOSIS — M810 Age-related osteoporosis without current pathological fracture: Secondary | ICD-10-CM | POA: Diagnosis not present

## 2022-03-22 DIAGNOSIS — E559 Vitamin D deficiency, unspecified: Secondary | ICD-10-CM | POA: Diagnosis not present

## 2022-03-22 DIAGNOSIS — N1832 Chronic kidney disease, stage 3b: Secondary | ICD-10-CM | POA: Diagnosis not present

## 2022-03-22 DIAGNOSIS — E785 Hyperlipidemia, unspecified: Secondary | ICD-10-CM | POA: Diagnosis not present

## 2022-03-23 DIAGNOSIS — D519 Vitamin B12 deficiency anemia, unspecified: Secondary | ICD-10-CM | POA: Diagnosis not present

## 2022-03-23 DIAGNOSIS — I10 Essential (primary) hypertension: Secondary | ICD-10-CM | POA: Diagnosis not present

## 2022-03-23 DIAGNOSIS — E559 Vitamin D deficiency, unspecified: Secondary | ICD-10-CM | POA: Diagnosis not present

## 2022-03-23 DIAGNOSIS — E119 Type 2 diabetes mellitus without complications: Secondary | ICD-10-CM | POA: Diagnosis not present

## 2022-03-25 DIAGNOSIS — F0390 Unspecified dementia without behavioral disturbance: Secondary | ICD-10-CM

## 2022-08-02 IMAGING — DX DG CHEST 2V
2 series · 2 of 2 positions shown · non-contrast
Comparison: Chest radiograph 12/08/2020

CLINICAL DATA: Chronic cough.

EXAM:
CHEST - 2 VIEW

[chest pa]
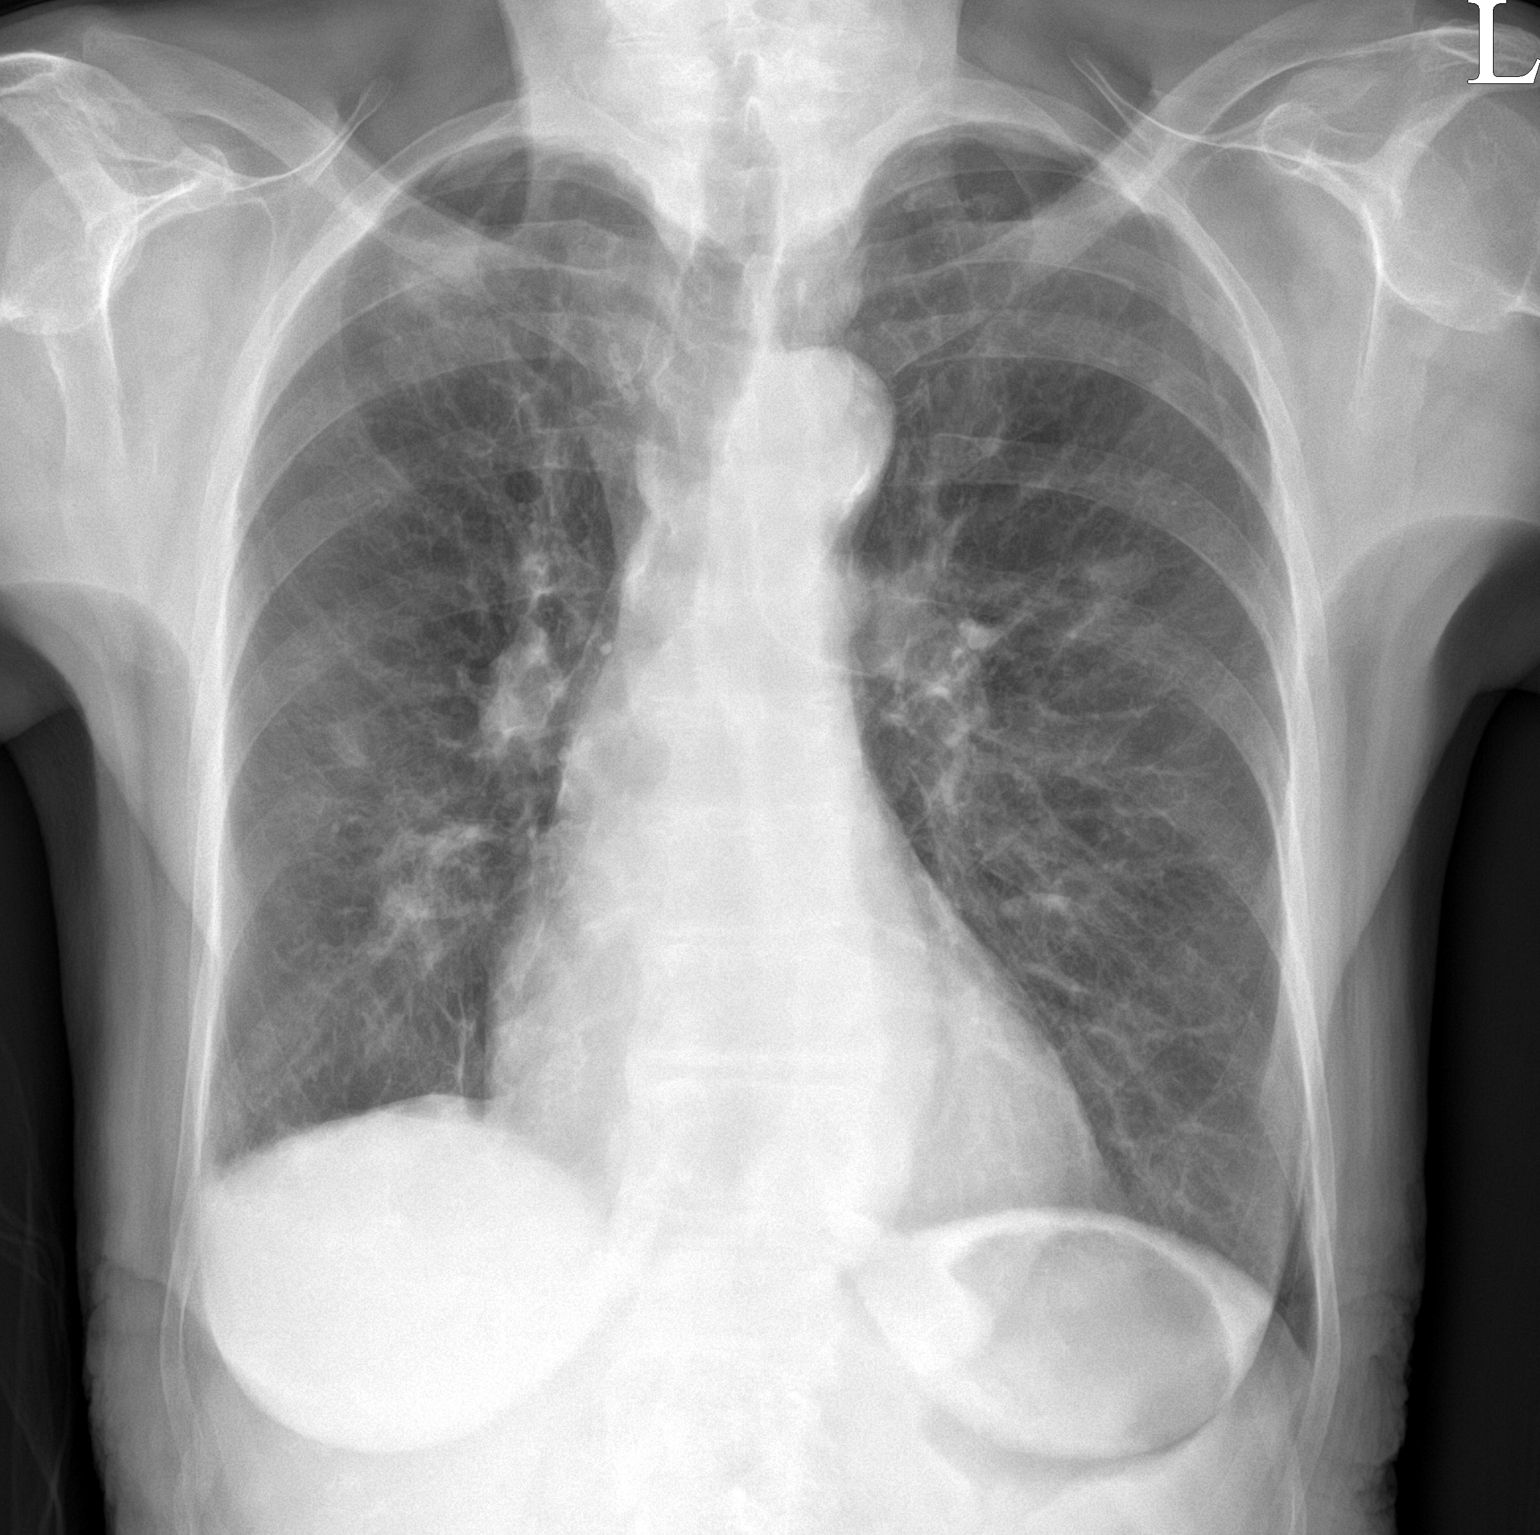

[chest lat]
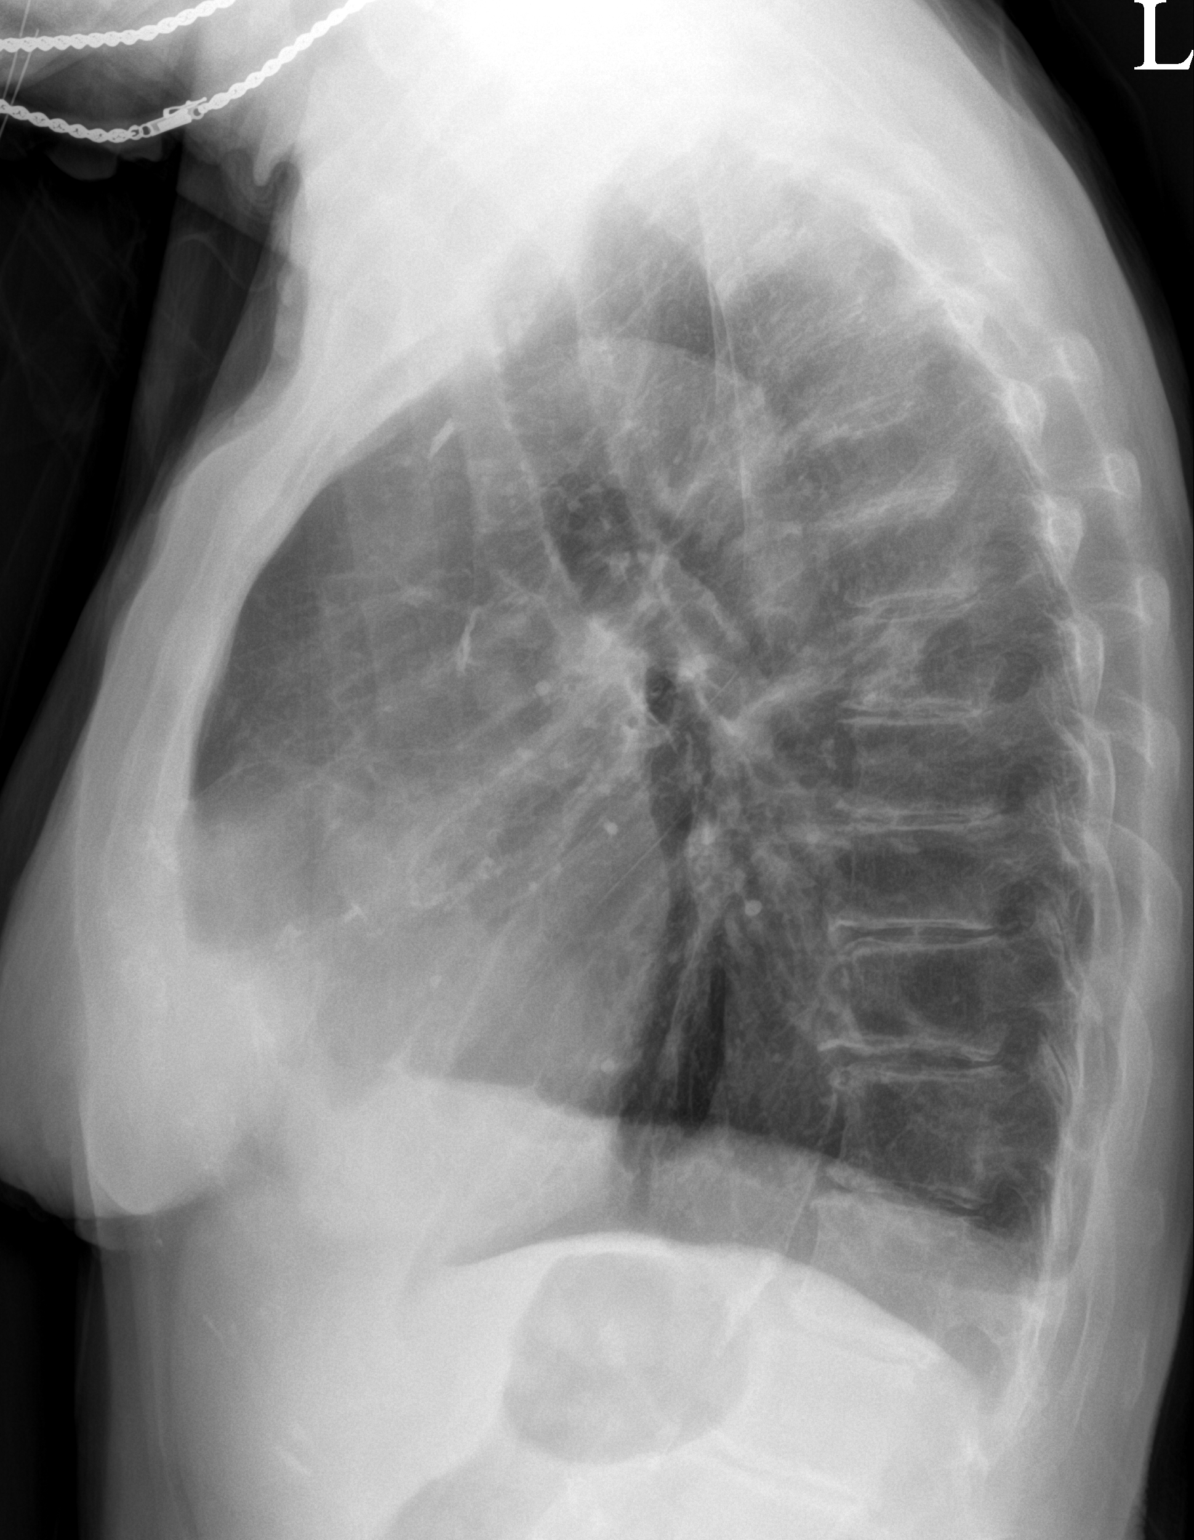

[2 of 2 positions shown; findings below may reference images not displayed]

FINDINGS: Stable cardiac and mediastinal contours. Aortic atherosclerosis.
Interval development of nodule within the left mid lung. Probable
right apical nodular opacity. Emphysematous change. Thoracic spine
degenerative changes.
IMPRESSION: New left mid lung nodule and possible lung nodule right lung apex.
Recommend further evaluation with chest CT.

These results will be called to the ordering clinician or
representative by the Radiologist Assistant, and communication
documented in the PACS or [REDACTED].
